# Patient Record
Sex: Male | Born: 1980 | Race: White | Hispanic: No | Marital: Single | Smoking: Former smoker
Health system: Southern US, Community
[De-identification: ages and names within clinical notes are randomized; demographics above are authoritative.]

## PROBLEM LIST (undated history)

## (undated) DIAGNOSIS — F329 Major depressive disorder, single episode, unspecified: Secondary | ICD-10-CM

## (undated) DIAGNOSIS — F1411 Cocaine abuse, in remission: Secondary | ICD-10-CM

## (undated) DIAGNOSIS — F1511 Other stimulant abuse, in remission: Secondary | ICD-10-CM

## (undated) DIAGNOSIS — F410 Panic disorder [episodic paroxysmal anxiety] without agoraphobia: Secondary | ICD-10-CM

## (undated) DIAGNOSIS — F1910 Other psychoactive substance abuse, uncomplicated: Secondary | ICD-10-CM

## (undated) DIAGNOSIS — R42 Dizziness and giddiness: Secondary | ICD-10-CM

## (undated) DIAGNOSIS — R0789 Other chest pain: Secondary | ICD-10-CM

## (undated) DIAGNOSIS — J302 Other seasonal allergic rhinitis: Secondary | ICD-10-CM

## (undated) DIAGNOSIS — F411 Generalized anxiety disorder: Secondary | ICD-10-CM

## (undated) DIAGNOSIS — K2920 Alcoholic gastritis without bleeding: Secondary | ICD-10-CM

## (undated) DIAGNOSIS — F419 Anxiety disorder, unspecified: Secondary | ICD-10-CM

## (undated) DIAGNOSIS — K219 Gastro-esophageal reflux disease without esophagitis: Secondary | ICD-10-CM

## (undated) DIAGNOSIS — G709 Myoneural disorder, unspecified: Secondary | ICD-10-CM

## (undated) HISTORY — DX: Anxiety disorder, unspecified: F41.9

## (undated) HISTORY — DX: Other psychoactive substance abuse, uncomplicated: F19.10

---

## 2014-08-31 ENCOUNTER — Emergency Department: Payer: Self-pay | Admitting: Emergency Medicine

## 2014-08-31 LAB — CBC WITH DIFFERENTIAL/PLATELET
BASOS ABS: 0.1 10*3/uL (ref 0.0–0.1)
Basophil %: 0.6 %
Eosinophil #: 0.1 10*3/uL (ref 0.0–0.7)
Eosinophil %: 1.1 %
HCT: 46.6 % (ref 40.0–52.0)
HGB: 15.8 g/dL (ref 13.0–18.0)
LYMPHS ABS: 2 10*3/uL (ref 1.0–3.6)
LYMPHS PCT: 16.6 %
MCH: 31.9 pg (ref 26.0–34.0)
MCHC: 33.9 g/dL (ref 32.0–36.0)
MCV: 94 fL (ref 80–100)
MONOS PCT: 6 %
Monocyte #: 0.7 x10 3/mm (ref 0.2–1.0)
NEUTROS ABS: 9 10*3/uL — AB (ref 1.4–6.5)
Neutrophil %: 75.7 %
PLATELETS: 223 10*3/uL (ref 150–440)
RBC: 4.96 10*6/uL (ref 4.40–5.90)
RDW: 12.7 % (ref 11.5–14.5)
WBC: 11.9 10*3/uL — ABNORMAL HIGH (ref 3.8–10.6)

## 2014-08-31 LAB — BASIC METABOLIC PANEL
ANION GAP: 5 — AB (ref 7–16)
BUN: 12 mg/dL (ref 7–18)
CREATININE: 1.17 mg/dL (ref 0.60–1.30)
Calcium, Total: 8.9 mg/dL (ref 8.5–10.1)
Chloride: 105 mmol/L (ref 98–107)
Co2: 28 mmol/L (ref 21–32)
EGFR (African American): >60
EGFR (Non-African Amer.): >60
Glucose: 99 mg/dL (ref 65–99)
Osmolality: 275 (ref 275–301)
Potassium: 3.8 mmol/L (ref 3.5–5.1)
Sodium: 138 mmol/L (ref 136–145)

## 2014-08-31 LAB — URINALYSIS, COMPLETE
BLOOD: NEGATIVE
Bacteria: NONE SEEN
Bilirubin,UR: NEGATIVE
Glucose,UR: NEGATIVE mg/dL (ref 0–75)
Ketone: NEGATIVE
Leukocyte Esterase: NEGATIVE
Nitrite: NEGATIVE
PH: 5 (ref 4.5–8.0)
Protein: NEGATIVE
RBC,UR: NONE SEEN /HPF (ref 0–5)
Specific Gravity: 1.016 (ref 1.003–1.030)
WBC UR: NONE SEEN /HPF (ref 0–5)

## 2015-06-27 ENCOUNTER — Emergency Department: Payer: Self-pay

## 2015-06-27 ENCOUNTER — Encounter: Payer: Self-pay | Admitting: Emergency Medicine

## 2015-06-27 ENCOUNTER — Emergency Department
Admission: EM | Admit: 2015-06-27 | Discharge: 2015-06-27 | Disposition: A | Discharge status: Home / Self Care | Payer: Self-pay | Attending: Emergency Medicine | Admitting: Emergency Medicine

## 2015-06-27 DIAGNOSIS — T148XXA Other injury of unspecified body region, initial encounter: Secondary | ICD-10-CM

## 2015-06-27 DIAGNOSIS — Z880 Allergy status to penicillin: Secondary | ICD-10-CM | POA: Insufficient documentation

## 2015-06-27 DIAGNOSIS — F17200 Nicotine dependence, unspecified, uncomplicated: Secondary | ICD-10-CM | POA: Insufficient documentation

## 2015-06-27 DIAGNOSIS — Y9289 Other specified places as the place of occurrence of the external cause: Secondary | ICD-10-CM | POA: Insufficient documentation

## 2015-06-27 DIAGNOSIS — Y288XXA Contact with other sharp object, undetermined intent, initial encounter: Secondary | ICD-10-CM | POA: Insufficient documentation

## 2015-06-27 DIAGNOSIS — Y990 Civilian activity done for income or pay: Secondary | ICD-10-CM | POA: Insufficient documentation

## 2015-06-27 DIAGNOSIS — S61432A Puncture wound without foreign body of left hand, initial encounter: Secondary | ICD-10-CM | POA: Insufficient documentation

## 2015-06-27 DIAGNOSIS — Y9389 Activity, other specified: Secondary | ICD-10-CM | POA: Insufficient documentation

## 2015-06-27 MED ORDER — BACITRACIN ZINC 500 UNIT/GM EX OINT
1.0000 "application " | TOPICAL_OINTMENT | Freq: Two times a day (BID) | CUTANEOUS | Status: DC
  Administered 2015-06-27: 1 via TOPICAL
  Filled 2015-06-27: qty 0.9

## 2015-06-27 MED ORDER — DOXYCYCLINE HYCLATE 100 MG PO CAPS: 100.0000 mg | ORAL_CAPSULE | Freq: Two times a day (BID) | ORAL | Status: DC

## 2015-06-27 MED ORDER — TRAMADOL HCL 50 MG PO TABS: 50.0000 mg | ORAL_TABLET | Freq: Four times a day (QID) | ORAL | Status: DC | PRN

## 2015-06-27 NOTE — ED Notes (Signed)
Pt states he was cutting boards and got a sliver in his had, pt is most affected on the thumb, pt is unable to use flexion on the thumb but can slowly bring it in.

## 2015-06-27 NOTE — ED Notes (Signed)
Betadine/NS placed in basin for pt to soak hand.

## 2015-06-27 NOTE — ED Notes (Signed)
Presents with pain and swelling to left hand   States he got a piece of wood stuck in hand eariler

## 2015-06-27 NOTE — ED Notes (Signed)
Bacitracin and kerlix applied to left hand

## 2015-06-27 NOTE — ED Provider Notes (Signed)
[facility] Emergency Department Provider Note  ____________________________________________  Time seen: Approximately 2:01 PM  I have reviewed the triage vital signs and the nursing notes.   HISTORY  Chief Complaint Hand Pain   HPI Jacob Mckinney is a 34 y.o. male who presents to the emergency department for evaluation of left hand pain. He states that while at work he got a large splinter in the left hand around 9am. He is now having significant pain and swelling. He states that he pulled out what he could, but is unsure that he remove the entire splinter. Tetanus shot is up-to-date.  History reviewed. No pertinent past medical history.  There are no active problems to display for this patient.   History reviewed. No pertinent past surgical history.  Current Outpatient Rx  Name  Route  Sig  Dispense  Refill  . doxycycline (VIBRAMYCIN) 100 MG capsule   Oral   Take 1 capsule (100 mg total) by mouth 2 (two) times daily.   20 capsule   0   . traMADol (ULTRAM) 50 MG tablet   Oral   Take 1 tablet (50 mg total) by mouth every 6 (six) hours as needed.   9 tablet   0     Allergies Penicillins  No family history on file.  Social History Social History  Substance Use Topics  . Smoking status: Current Every Day Smoker  . Smokeless tobacco: None  . Alcohol Use: No    Review of Systems Constitutional: No recent illness. Eyes: No visual changes. ENT: No sore throat. Cardiovascular: Denies chest pain or palpitations. Respiratory: Denies shortness of breath. Gastrointestinal: No abdominal pain.  Genitourinary: Negative for dysuria. Musculoskeletal: Pain in left hand Skin: Negative for rash. Neurological: Negative for headaches, focal weakness or numbness. 10-point ROS otherwise negative.  ____________________________________________   PHYSICAL EXAM:  VITAL SIGNS: ED Triage Vitals  Enc Vitals Group     BP 06/27/15 1337 126/61  mmHg     Pulse Rate 06/27/15 1337 74     Resp 06/27/15 1337 20     Temp 06/27/15 1337 98.2 F (36.8 C)     Temp Source 06/27/15 1337 Oral     SpO2 06/27/15 1337 98 %     Weight 06/27/15 1337 185 lb (83.915 kg)     Height 06/27/15 1337 6\' 2"  (1.88 m)     Head Cir --      Peak Flow --      Pain Score 06/27/15 1337 7     Pain Loc --      Pain Edu? --      Excl. in GC? --     Constitutional: Alert and oriented. Well appearing and in no acute distress. Eyes: Conjunctivae are normal. EOMI. Head: Atraumatic. Nose: No congestion/rhinnorhea. Neck: No stridor.  Respiratory: Normal respiratory effort.   Musculoskeletal: Minimal swelling in the thenar eminence of the left hand without obvious foreign body. There is a puncture wound noted in the webspace of the left thumb. There is full range of motion of the left hand and fingers. Neurologic:  Normal speech and language. No gross focal neurologic deficits are appreciated. Speech is normal. No gait instability. Skin:  Skin is warm, dry and intact. Atraumatic. Psychiatric: Mood and affect are normal. Speech and behavior are normal.  ____________________________________________   LABS (all labs ordered are listed, but only abnormal results are displayed)  Labs Reviewed - No data to display ____________________________________________  RADIOLOGY  No retained radiopaque foreign  body.  I, Homer Pfeifer, personally viewed and evaluated these images (plain radiographs) as part of my medical decision making.   ____________________________________________   PROCEDURES  Procedure(s) performed:  Hand soaked in Betadine and normal saline. Bacitracin was applied to the puncture wound.   ____________________________________________   INITIAL IMPRESSION / ASSESSMENT AND PLAN / ED COURSE  Pertinent labs & imaging results that were available during my care of the patient were reviewed by me and considered in my medical decision making (see  chart for details).  Patient has an allergy to penicillin which prevents prescribing Augmentin. He will take doxycycline. He was advised to take the doxycycline until finished, even if he has no more pain or swelling. He was advised to follow-up with his primary care provider or return to the emergency department for symptoms that change or worsen. ____________________________________________   FINAL CLINICAL IMPRESSION(S) / ED DIAGNOSES  Final diagnoses:  Puncture wound       Chinita Pester, FNP 06/27/15 1531  Myrna Blazer, MD 06/27/15 1539

## 2019-06-28 ENCOUNTER — Emergency Department: Payer: Self-pay

## 2019-06-28 ENCOUNTER — Encounter: Payer: Self-pay | Admitting: Emergency Medicine

## 2019-06-28 ENCOUNTER — Other Ambulatory Visit: Payer: Self-pay

## 2019-06-28 ENCOUNTER — Emergency Department
Admission: EM | Admit: 2019-06-28 | Discharge: 2019-06-28 | Disposition: A | Discharge status: Home / Self Care | Payer: Self-pay | Attending: Emergency Medicine | Admitting: Emergency Medicine

## 2019-06-28 DIAGNOSIS — K2920 Alcoholic gastritis without bleeding: Secondary | ICD-10-CM | POA: Insufficient documentation

## 2019-06-28 DIAGNOSIS — F17200 Nicotine dependence, unspecified, uncomplicated: Secondary | ICD-10-CM | POA: Insufficient documentation

## 2019-06-28 LAB — URINALYSIS, COMPLETE (UACMP) WITH MICROSCOPIC
Bacteria, UA: NONE SEEN
Bilirubin Urine: NEGATIVE
Glucose, UA: NEGATIVE mg/dL
Hgb urine dipstick: NEGATIVE
Ketones, ur: 20 mg/dL — AB
Leukocytes,Ua: NEGATIVE
Nitrite: NEGATIVE
Protein, ur: 30 mg/dL — AB
Specific Gravity, Urine: 1.014 (ref 1.005–1.030)
Squamous Epithelial / LPF: NONE SEEN (ref 0–5)
pH: 6 (ref 5.0–8.0)

## 2019-06-28 LAB — COMPREHENSIVE METABOLIC PANEL
ALT: 116 U/L — ABNORMAL HIGH (ref 0–44)
AST: 171 U/L — ABNORMAL HIGH (ref 15–41)
Albumin: 5 g/dL (ref 3.5–5.0)
Alkaline Phosphatase: 67 U/L (ref 38–126)
Anion gap: 15 (ref 5–15)
BUN: 17 mg/dL (ref 6–20)
CO2: 23 mmol/L (ref 22–32)
Calcium: 9.4 mg/dL (ref 8.9–10.3)
Chloride: 98 mmol/L (ref 98–111)
Creatinine, Ser: 1.09 mg/dL (ref 0.61–1.24)
GFR calc Af Amer: >60 mL/min (ref >60)
GFR calc non Af Amer: >60 mL/min (ref >60)
Glucose, Bld: 101 mg/dL — ABNORMAL HIGH (ref 70–99)
Potassium: 3.9 mmol/L (ref 3.5–5.1)
Sodium: 136 mmol/L (ref 135–145)
Total Bilirubin: 1.3 mg/dL — ABNORMAL HIGH (ref 0.3–1.2)
Total Protein: 8.1 g/dL (ref 6.5–8.1)

## 2019-06-28 LAB — CBC
HCT: 48.5 % (ref 39.0–52.0)
Hemoglobin: 16.8 g/dL (ref 13.0–17.0)
MCH: 32.1 pg (ref 26.0–34.0)
MCHC: 34.6 g/dL (ref 30.0–36.0)
MCV: 92.7 fL (ref 80.0–100.0)
Platelets: 296 10*3/uL (ref 150–400)
RBC: 5.23 MIL/uL (ref 4.22–5.81)
RDW: 12.7 % (ref 11.5–15.5)
WBC: 13 10*3/uL — ABNORMAL HIGH (ref 4.0–10.5)
nRBC: 0 % (ref 0.0–0.2)

## 2019-06-28 LAB — LIPASE, BLOOD: Lipase: 23 U/L (ref 11–51)

## 2019-06-28 MED ORDER — ALUM & MAG HYDROXIDE-SIMETH 200-200-20 MG/5ML PO SUSP
15.0000 mL | Freq: Once | ORAL | Status: AC
  Administered 2019-06-28: 15 mL via ORAL
  Filled 2019-06-28: qty 30

## 2019-06-28 MED ORDER — FAMOTIDINE 20 MG PO TABS
20.0000 mg | ORAL_TABLET | Freq: Once | ORAL | Status: AC
  Administered 2019-06-28: 20 mg via ORAL
  Filled 2019-06-28: qty 1

## 2019-06-28 MED ORDER — SODIUM CHLORIDE 0.9 % IV BOLUS
1000.0000 mL | Freq: Once | INTRAVENOUS | Status: AC
  Administered 2019-06-28: 1000 mL via INTRAVENOUS

## 2019-06-28 MED ORDER — SODIUM CHLORIDE 0.9% FLUSH: 3.0000 mL | Freq: Once | INTRAVENOUS | Status: DC

## 2019-06-28 MED ORDER — IOHEXOL 300 MG/ML  SOLN
100.0000 mL | Freq: Once | INTRAMUSCULAR | Status: AC | PRN
  Administered 2019-06-28: 100 mL via INTRAVENOUS
  Filled 2019-06-28: qty 100

## 2019-06-28 MED ORDER — IOHEXOL 9 MG/ML PO SOLN
500.0000 mL | Freq: Once | ORAL | Status: AC
  Administered 2019-06-28: 500 mL via ORAL
  Filled 2019-06-28: qty 500

## 2019-06-28 NOTE — Discharge Instructions (Addendum)
? ?  Please return to the emergency room right away if you are to develop a fever, severe nausea, your pain becomes severe or worsens, you are unable to keep food down, begin vomiting any dark or bloody fluid, you develop any dark or bloody stools, feel dehydrated, or other new concerns or symptoms arise. ? ?

## 2019-06-28 NOTE — ED Triage Notes (Addendum)
EMS from home , LUQ pain x1 day increased pain on palpation .  Denies N/V/D, Last BM yesterday

## 2019-06-28 NOTE — ED Notes (Signed)
See triage note.  Presents with left upper abd pain    States he drank heavy on Tuesday  Developed pain on weds am  Denies any n/v  abd is tender to palpation to LUQ

## 2019-06-28 NOTE — ED Provider Notes (Signed)
[facility] Emergency Department Provider Note   ____________________________________________   First MD Initiated Contact with Patient 06/28/19 1308     (approximate)  I have reviewed the triage vital signs and the nursing notes.   HISTORY  Chief Complaint Abdominal Pain    HPI Jacob Mckinney is a 38 y.o. male needs for evaluation for left upper abdominal pain  Patient reports he started having some discomfort in his stomach last night, left upper abdomen.  And this morning it seemed to severely worsen.  He felt severe pain in the left upper quadrant.  He reports that when it started he felt very sweaty, lightheaded.  He is feeling improved now, he reports that he did associate drinking quite heavily 2 days prior as he recently broke up with his wife  He reports has been under quite a bit of stress the last 2 days having broken off his relationship with his wife  Denies any nausea or vomiting.  No diarrhea.  Pain is improved now but he is still having some discomfort located below the left lower rib cage in the left upper abdomen  Denies any previous abdominal surgeries.  No past medical history takes no medications no allergies  except penicillin  History reviewed. No pertinent past medical history.  There are no active problems to display for this patient.   History reviewed. No pertinent surgical history.  Prior to Admission medications   Not on File    Allergies Penicillins  No family history on file.  Social History Social History   Tobacco Use   Smoking status: Current Every Day Smoker   Smokeless tobacco: Never Used  Substance Use Topics   Alcohol use: No   Drug use: Not on file    Review of Systems Constitutional: No fever/chills.  No known Covid exposure.  Was routinely tested 2 weeks ago negative Eyes: No visual changes. ENT: No sore throat. Cardiovascular: Denies chest pain. Respiratory: Denies shortness of  breath. Gastrointestinal: See HPI Genitourinary: Negative for dysuria. Musculoskeletal: Negative for back pain. Skin: Negative for rash.  Got very sweaty when the pain was severe but that got better Neurological: Negative for headaches or weakness    ____________________________________________   PHYSICAL EXAM:  VITAL SIGNS: ED Triage Vitals  Enc Vitals Group     BP 06/28/19 1106 (!) 132/92     Pulse Rate 06/28/19 1106 (!) 116     Resp 06/28/19 1106 18     Temp 06/28/19 1106 99.4 F (37.4 C)     Temp Source 06/28/19 1106 Oral     SpO2 06/28/19 1106 100 %     Weight 06/28/19 1107 200 lb (90.7 kg)     Height 06/28/19 1107 6\' 2"  (1.88 m)     Head Circumference --      Peak Flow --      Pain Score 06/28/19 1106 8     Pain Loc --      Pain Edu? --      Excl. in GC? --     Constitutional: Alert and oriented. Well appearing and in no acute distress. Eyes: Conjunctivae are normal. Head: Atraumatic. Nose: No congestion/rhinnorhea. Mouth/Throat: Mucous membranes are moist. Neck: No stridor.  Cardiovascular: Normal rate, regular rhythm. Grossly normal heart sounds.  Good peripheral circulation. Respiratory: Normal respiratory effort.  No retractions. Lungs CTAB. Gastrointestinal: Soft and nontender except he does report some moderate tenderness in the left upper quadrant without rebound or guarding.. No distention. Musculoskeletal:  No lower extremity tenderness nor edema. Neurologic:  Normal speech and language. No gross focal neurologic deficits are appreciated.  Skin:  Skin is warm, dry and intact. No rash noted. Psychiatric: Mood and affect are normal. Speech and behavior are normal.  ____________________________________________   LABS (all labs ordered are listed, but only abnormal results are displayed)  Labs Reviewed  COMPREHENSIVE METABOLIC PANEL - Abnormal; Notable for the following components:      Result Value   Glucose, Bld 101 (*)    AST 171 (*)    ALT 116  (*)    Total Bilirubin 1.3 (*)    All other components within normal limits  CBC - Abnormal; Notable for the following components:   WBC 13.0 (*)    All other components within normal limits  URINALYSIS, COMPLETE (UACMP) WITH MICROSCOPIC - Abnormal; Notable for the following components:   Color, Urine YELLOW (*)    APPearance CLEAR (*)    Ketones, ur 20 (*)    Protein, ur 30 (*)    All other components within normal limits  LIPASE, BLOOD   ____________________________________________  EKG  Reviewed entered by me at 1120 Heart rate 110 QRS 80 QTc 416 Sinus tachycardia, no evidence of acute ischemia ____________________________________________  RADIOLOGY  Ct Abdomen Pelvis W Contrast  Result Date: 06/28/2019 CLINICAL DATA:  Left upper quadrant abdominal pain with pain to palpation. EXAM: CT ABDOMEN AND PELVIS WITH CONTRAST TECHNIQUE: Multidetector CT imaging of the abdomen and pelvis was performed using the standard protocol following bolus administration of intravenous contrast. CONTRAST:  167mL OMNIPAQUE IOHEXOL 300 MG/ML  SOLN COMPARISON:  Abdominal radiographs dated 06/28/2019 FINDINGS: Lower chest: Normal. Hepatobiliary: Small focal area of fatty infiltration in the left lobe adjacent to the falciform ligament. Liver parenchyma is otherwise normal. Biliary tree is normal. Pancreas: Unremarkable. No pancreatic ductal dilatation or surrounding inflammatory changes. Spleen: Normal in size without focal abnormality. Adrenals/Urinary Tract: Adrenal glands are unremarkable. Kidneys are normal, without renal calculi, focal lesion, or hydronephrosis. Bladder is unremarkable. Stomach/Bowel: Stomach is within normal limits. Appendix appears normal. No evidence of bowel wall thickening, distention, or inflammatory changes. Vascular/Lymphatic: No significant vascular findings are present. No enlarged abdominal or pelvic lymph nodes. Reproductive: Prostate is unremarkable. Other: No abdominal wall  hernia or abnormality. No abdominopelvic ascites. Musculoskeletal: No acute or significant osseous findings. IMPRESSION: Benign-appearing abdomen and pelvis. Electronically Signed   By: Lorriane Shire M.D.   On: 06/28/2019 13:59   Dg Abd 2 Views  Result Date: 06/28/2019 CLINICAL DATA:  Left upper quadrant abdominal pain for 1 day. EXAM: ABDOMEN - 2 VIEW COMPARISON:  None. FINDINGS: The lung bases are clear. The abdominal bowel gas pattern is unremarkable. There is scattered air in the small bowel and scattered air and stool throughout the colon. No findings for obstruction or perforation. The soft tissue shadows are maintained. No worrisome calcifications. The bony structures are normal. IMPRESSION: No plain film findings for an acute abdominal process. Electronically Signed   By: Marijo Sanes M.D.   On: 06/28/2019 11:57   CT scan reviewed negative for acute finding.  Some notation of fatty infiltrate left lobe of the liver, patient denies regular heavy alcohol abuse.  Reassuring examination after CT reports he does feel quite a bit better with medications.  ____________________________________________   PROCEDURES  Procedure(s) performed: None  Procedures  Critical Care performed: No  ____________________________________________   INITIAL IMPRESSION / ASSESSMENT AND PLAN / ED COURSE  Pertinent labs & imaging results that  were available during my care of the patient were reviewed by me and considered in my medical decision making (see chart for details).   Differential diagnosis includes but is not limited to, abdominal perforation, aortic dissection, cholecystitis, appendicitis, diverticulitis, colitis, esophagitis/gastritis, kidney stone, pyelonephritis, urinary tract infection, aortic aneurysm. All are considered in decision and treatment plan. Based upon the patient's presentation and risk factors, question is the left upper quadrant pain could be related to his rather heavy alcohol  use.  He binge drank couple days ago, but reports he is not a routine drinker but drank stress from breaking up with his wife.  Reassuring clinical exam except he does have focal discomfort in the left upper quadrant.  We will proceed with CT to rule out other causes such as perforation, colitis, appendicitis esophagitis etc.  ----------------------------------------- 3:20 PM on 06/28/2019 -----------------------------------------  Return precautions and treatment recommendations and follow-up discussed with the patient who is agreeable with the plan.  Patient is resting comfortably.  Feels improved after medications.  No acute distress.  He has minor transaminitis, and he reports an episode of very heavy drinking where he drank at least 18 beers, thinks he had a few shots, but reports things got pretty hazy after that a few days ago.  I suspect this likely precipitated his symptoms and suffering alcoholic gastritis, possibly some very mild hepatitis though very reassuring examination at this time.  He is appropriate for ongoing outpatient treatment.  Patient agreeable with plan and has not plan to continue any episodes of heavy alcohol use.  Reports it was situational around his recent divorce  Return precautions and treatment recommendations and follow-up discussed with the patient who is agreeable with the plan.      ____________________________________________   FINAL CLINICAL IMPRESSION(S) / ED DIAGNOSES  Final diagnoses:  Acute alcoholic gastritis without hemorrhage        Note:  This document was prepared using Dragon voice recognition software and may include unintentional dictation errors       Sharyn Creamer, MD 06/28/19 1521

## 2020-07-05 IMAGING — CR DG ABDOMEN 2V
3 series · 3 of 3 positions shown · non-contrast
Comparison: None.

CLINICAL DATA: Left upper quadrant abdominal pain for 1 day.

EXAM:
ABDOMEN - 2 VIEW

[abdomen erect]
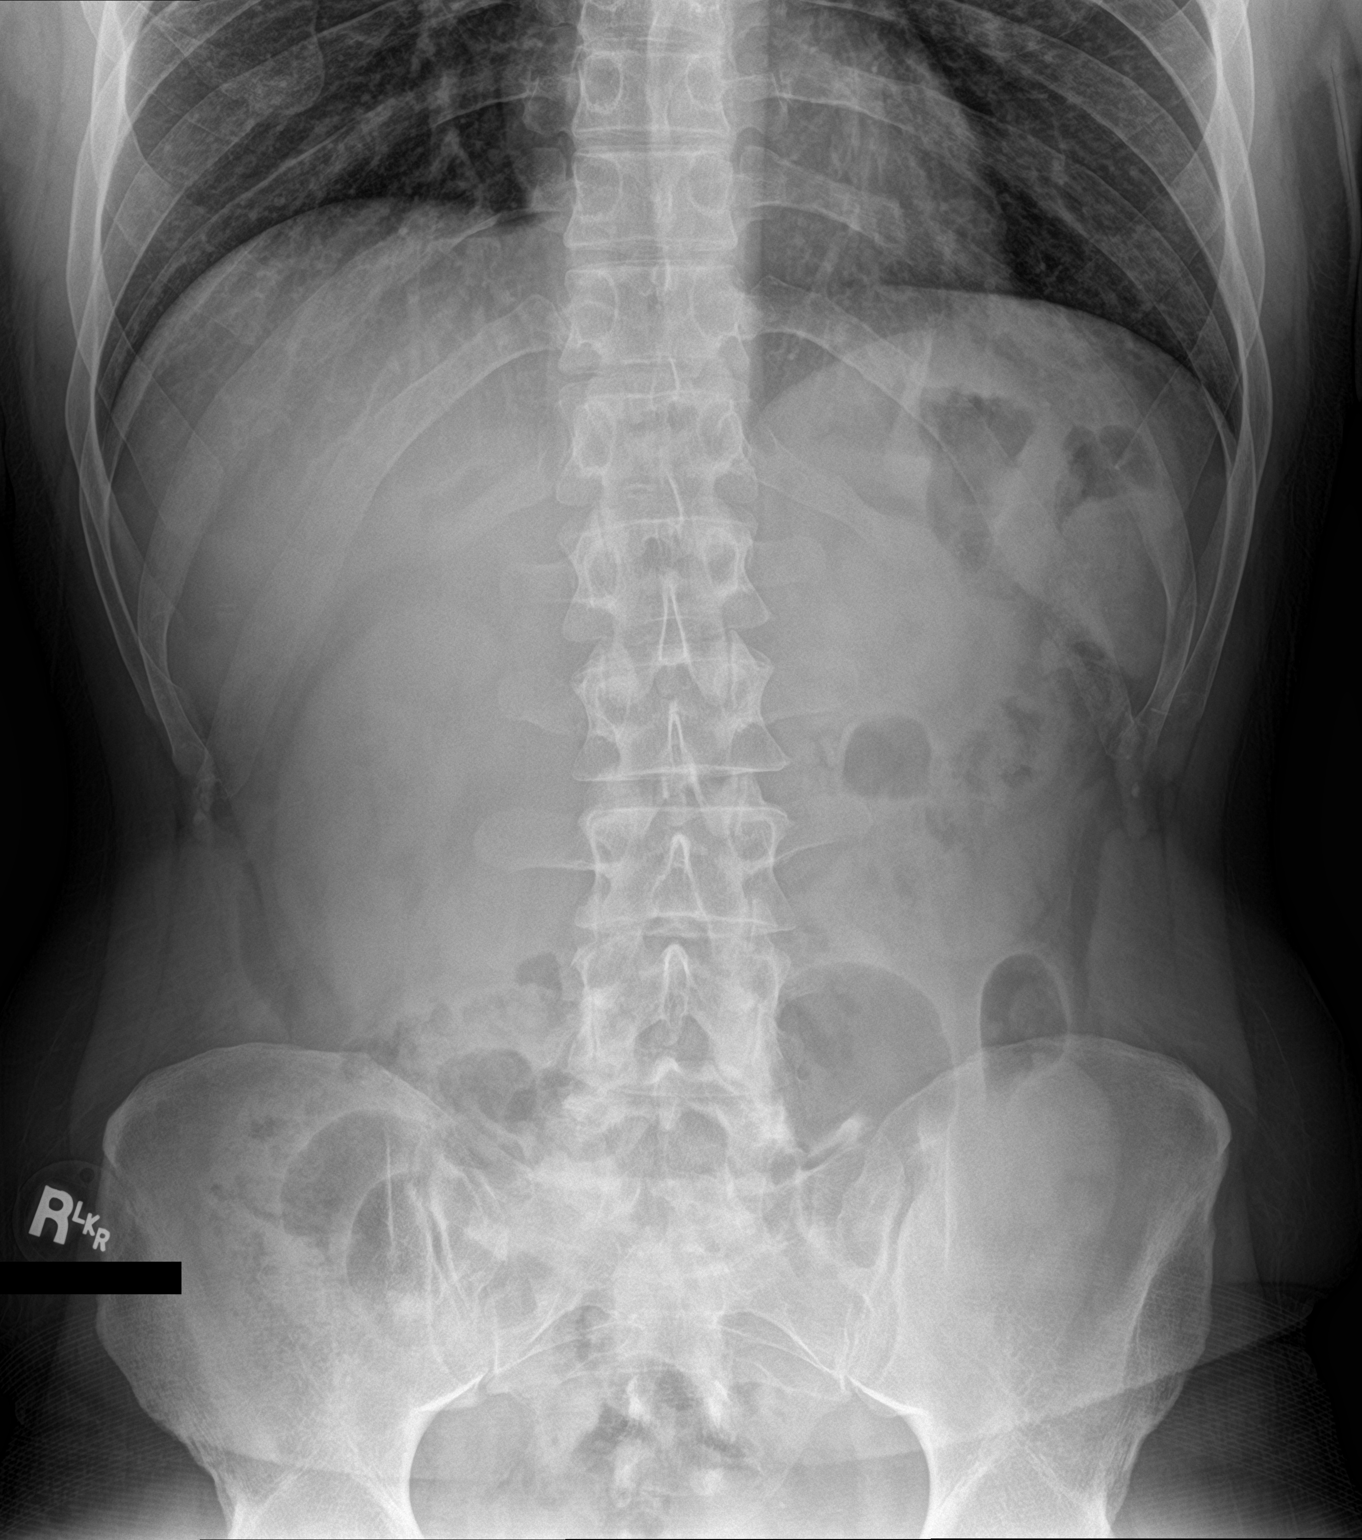

[abdomen supine (1 of 2)]
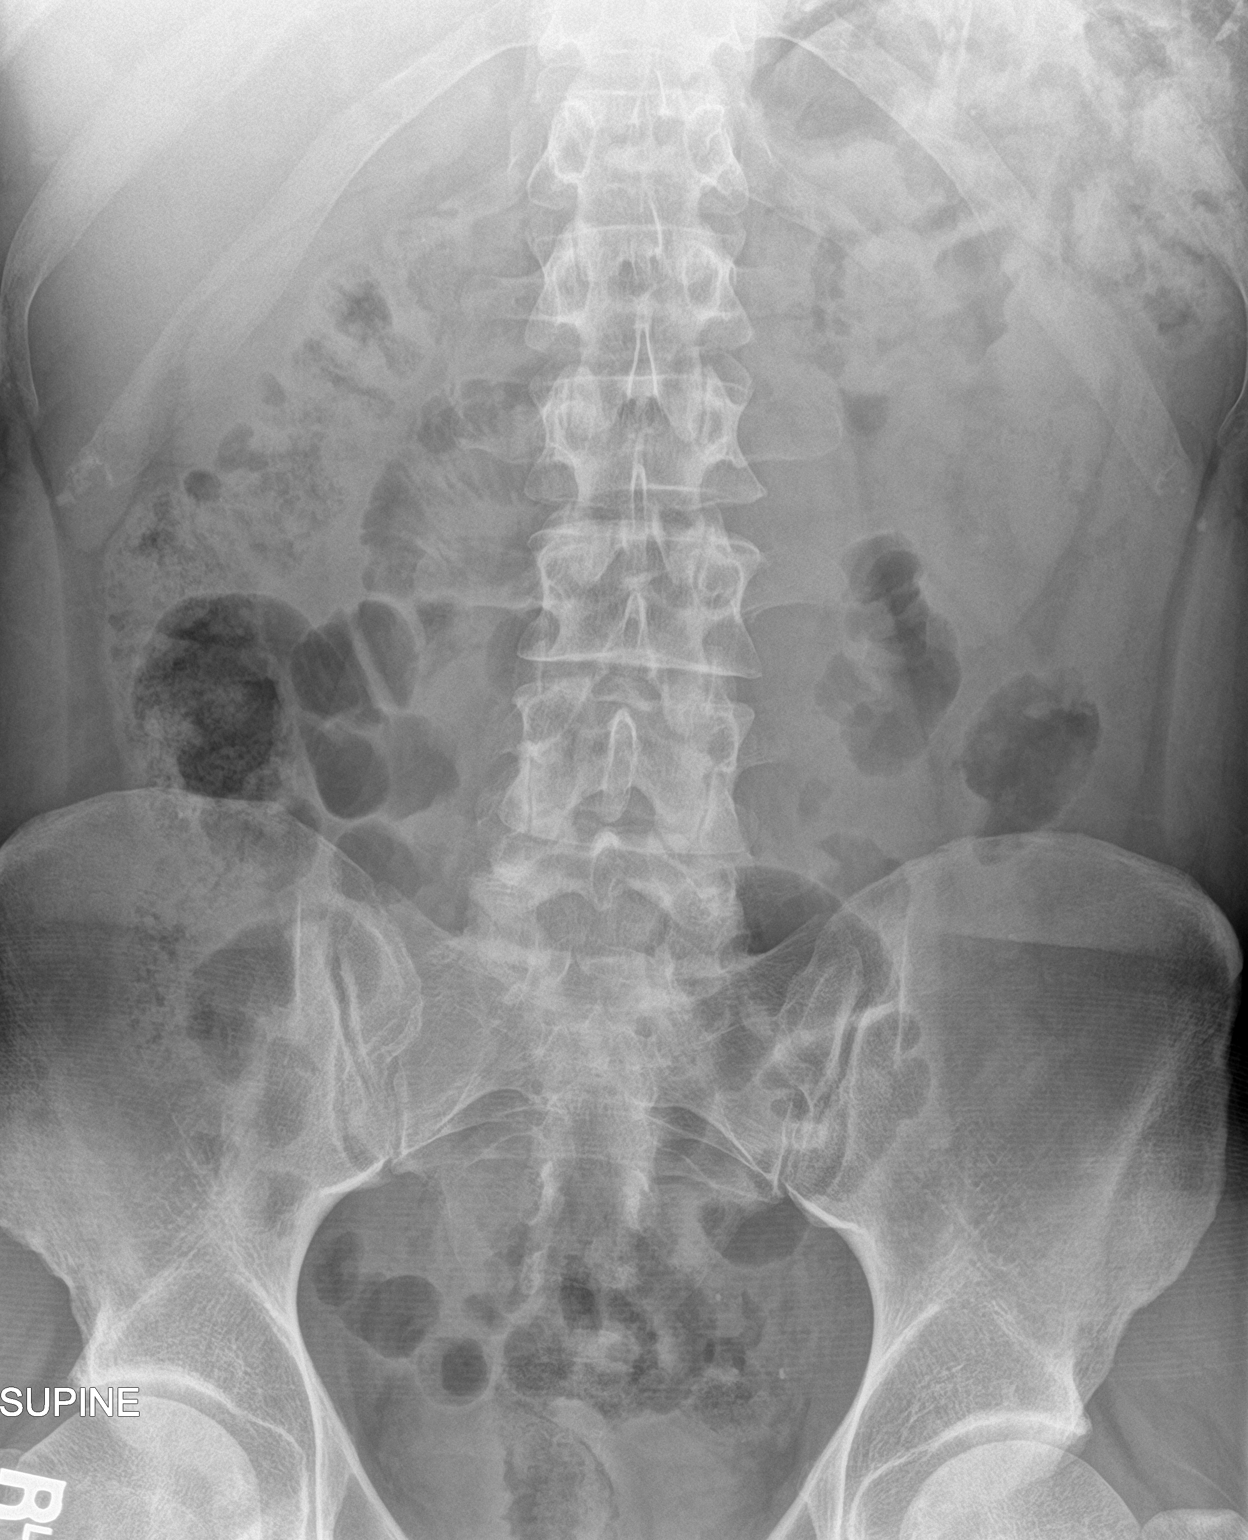

[abdomen supine (2 of 2)]
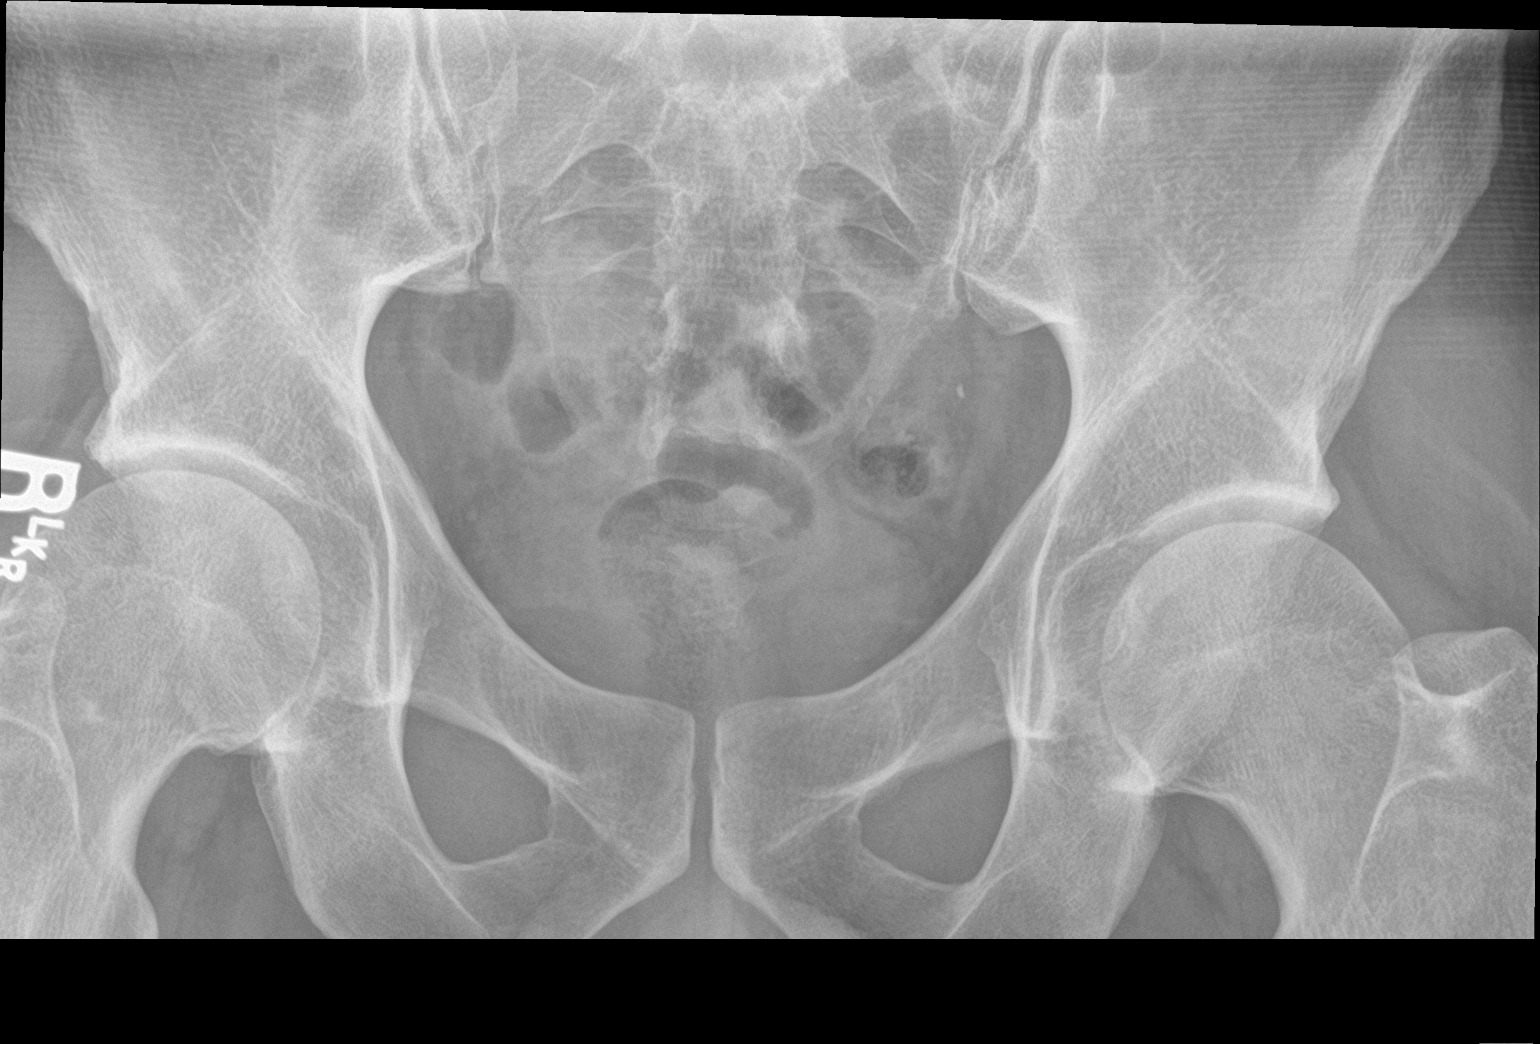

[3 of 3 positions shown; findings below may reference images not displayed]

FINDINGS: The lung bases are clear.

The abdominal bowel gas pattern is unremarkable. There is scattered
air in the small bowel and scattered air and stool throughout the
colon. No findings for obstruction or perforation. The soft tissue
shadows are maintained. No worrisome calcifications. The bony
structures are normal.
IMPRESSION: No plain film findings for an acute abdominal process.

## 2021-10-27 ENCOUNTER — Emergency Department
Admission: EM | Admit: 2021-10-27 | Discharge: 2021-10-27 | Disposition: A | Discharge status: Home / Self Care | Payer: Self-pay | Attending: Emergency Medicine | Admitting: Emergency Medicine

## 2021-10-27 ENCOUNTER — Emergency Department: Payer: Self-pay

## 2021-10-27 ENCOUNTER — Other Ambulatory Visit: Payer: Self-pay

## 2021-10-27 DIAGNOSIS — R079 Chest pain, unspecified: Secondary | ICD-10-CM | POA: Insufficient documentation

## 2021-10-27 DIAGNOSIS — F419 Anxiety disorder, unspecified: Secondary | ICD-10-CM | POA: Insufficient documentation

## 2021-10-27 LAB — CBC
HCT: 47.6 % (ref 39.0–52.0)
Hemoglobin: 16.3 g/dL (ref 13.0–17.0)
MCH: 32 pg (ref 26.0–34.0)
MCHC: 34.2 g/dL (ref 30.0–36.0)
MCV: 93.5 fL (ref 80.0–100.0)
Platelets: 218 10*3/uL (ref 150–400)
RBC: 5.09 MIL/uL (ref 4.22–5.81)
RDW: 13.1 % (ref 11.5–15.5)
WBC: 10.7 10*3/uL — ABNORMAL HIGH (ref 4.0–10.5)
nRBC: 0 % (ref 0.0–0.2)

## 2021-10-27 LAB — BASIC METABOLIC PANEL
Anion gap: 10 (ref 5–15)
BUN: 9 mg/dL (ref 6–20)
CO2: 26 mmol/L (ref 22–32)
Calcium: 9.6 mg/dL (ref 8.9–10.3)
Chloride: 101 mmol/L (ref 98–111)
Creatinine, Ser: 1.04 mg/dL (ref 0.61–1.24)
GFR, Estimated: >60 mL/min (ref >60)
Glucose, Bld: 130 mg/dL — ABNORMAL HIGH (ref 70–99)
Potassium: 4.3 mmol/L (ref 3.5–5.1)
Sodium: 137 mmol/L (ref 135–145)

## 2021-10-27 LAB — TROPONIN I (HIGH SENSITIVITY): Troponin I (High Sensitivity): 6 ng/L (ref <18)

## 2021-10-27 MED ORDER — HYDROXYZINE HCL 50 MG PO TABS: 50.0000 mg | ORAL_TABLET | Freq: Once | ORAL | Status: DC

## 2021-10-27 MED ORDER — HYDROXYZINE PAMOATE 100 MG PO CAPS
100.0000 mg | ORAL_CAPSULE | Freq: Three times a day (TID) | ORAL | 0 refills | Status: DC | PRN
Start: 2021-10-27 — End: 2023-02-24

## 2021-10-27 MED ORDER — HYDROXYZINE HCL 50 MG PO TABS
100.0000 mg | ORAL_TABLET | Freq: Once | ORAL | Status: AC
Start: 2021-10-27 — End: 2021-10-27
  Administered 2021-10-27: 100 mg via ORAL
  Filled 2021-10-27: qty 2

## 2021-10-27 NOTE — ED Triage Notes (Signed)
Pt comes with c/o left sided  CP that started yesterday. Pt states this got worse today. Pt state he knows what it is from. Pt admits to using cocaine and meth together. ?

## 2021-10-27 NOTE — ED Provider Notes (Signed)
? ?[facility] [address] ?Provider Note ? ? Event Date/Time  ? First MD Initiated Contact with Patient 10/27/21 2037   ?  (approximate) ?History  ?Chest Pain ? ?HPI ?Jacob Mckinney is a 41 y.o. 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  Patient states that he used intranasal cocaine, smoked methamphetamines, and drank alcohol 4 days prior to arrival and began having left-sided sharp chest pains at that time.  Patient states that these pains have been waxing and waning in intensity since onset and are accompanied by feelings of significant anxiety including the sensation that people are in his house and that he needs to get out.  Patient denies ever having similar symptoms in the past.  Patient endorses associated tachypnea and upper and lower extremity paresthesias.  Patient denies pain radiating to the neck, back, or left arm.  Denies any productive cough, nausea/vomiting, abdominal pain ?Physical Exam  ?Triage Vital Signs: ?ED Triage Vitals  ?Enc Vitals Group  ?   BP 10/27/21 1636 (!) 137/122  ?   Pulse Rate 10/27/21 1636 (!) 119  ?   Resp 10/27/21 1636 16  ?   Temp 10/27/21 1636 98 ?F (36.7 ?C)  ?   Temp Source 10/27/21 1636 Oral  ?   SpO2 10/27/21 1636 100 %  ?   Weight --   ?   Height --   ?   Head Circumference --   ?   Peak Flow --   ?   Pain Score 10/27/21 1632 10  ?   Pain Loc --   ?   Pain Edu? --   ?   Excl. in Minor? --   ? ?Most recent vital signs: ?Vitals:  ? 10/27/21 1636 10/27/21 2116  ?BP: (!) 137/122 (!) 127/92  ?Pulse: (!) 119 100  ?Resp: 16 20  ?Temp: 98 ?F (36.7 ?C) 98.1 ?F (36.7 ?C)  ?SpO2: 100% 100%  ? ?General: Awake, oriented x4. ?CV:  Good peripheral perfusion.  ?Resp:  Normal effort.  ?Abd:  No distention.  ?Other:  Anxious appearing middle aged 37 male laying in bed ?ED Results / Procedures / Treatments  ?Labs ?(all labs ordered are listed, but  only abnormal results are displayed) ?Labs Reviewed  ?BASIC METABOLIC PANEL - Abnormal; Notable for the following components:  ?    Result Value  ? Glucose, Bld 130 (*)   ? All other components within normal limits  ?CBC - Abnormal; Notable for the following components:  ? WBC 10.7 (*)   ? All other components within normal limits  ?TROPONIN I (HIGH SENSITIVITY)  ?TROPONIN I (HIGH SENSITIVITY)  ? ?EKG ?ED ECG REPORT ?I, Naaman Plummer, the attending physician, personally viewed and interpreted this ECG. ?Date: 10/27/2021 ?EKG Time: 1638 ?Rate: 109 ?Rhythm: normal sinus rhythm ?QRS Axis: normal ?Intervals: normal ?ST/T Wave abnormalities: normal ?Narrative Interpretation: no evidence of acute ischemia ?RADIOLOGY ?ED MD interpretation: 2 view chest x-ray interpreted by me shows no evidence of acute abnormalities including no pneumonia, pneumothorax, or widened mediastinum ?-Agree with radiology assessment ?Official radiology report(s): ?DG Chest 2 View ? ?Result Date: 10/27/2021 ?CLINICAL DATA:  Chest pain EXAM: CHEST - 2 VIEW COMPARISON:  None. FINDINGS: The heart size and mediastinal contours are within normal limits. Both lungs are clear. The visualized skeletal structures are unremarkable. IMPRESSION: No active cardiopulmonary disease. Electronically Signed   By:  Jacqulynn Cadet M.D.   On: 10/27/2021 16:53   ?PROCEDURES: ?Critical Care performed: No ?Procedures ?MEDICATIONS ORDERED IN ED: ?Medications  ?hydrOXYzine (ATARAX) tablet 100 mg (100 mg Oral Given 10/27/21 2110)  ? ?IMPRESSION / MDM / ASSESSMENT AND PLAN / ED COURSE  ?I reviewed the triage vital signs and the nursing notes. ?             ?               ?The patient is on the cardiac monitor to evaluate for evidence of arrhythmia and/or significant heart rate changes. ?Workup: ECG, CXR, CBC, BMP, Troponin ?Findings: ?ECG: No overt evidence of STEMI. No evidence of Brugada?s sign, delta wave, epsilon wave, significantly prolonged QTc, or malignant  arrhythmia ?HS Troponin: Negative x1 ?Other Labs unremarkable for emergent problems. ?CXR: Without PTX, PNA, or widened mediastinum ?Last Stress Test: Never ?Last Heart Catheterization: Never ?HEART Score: 2 ? ?Given History, Exam, and Workup I have low suspicion for ACS, Pneumothorax, Pneumonia, Pulmonary Embolus, Tamponade, Aortic Dissection or other emergent problem as a cause for this presentation.  Given patient's history of cocaine, methamphetamine, and alcohol abuse approximately 4 days ago as well as accompanying anxiety, chest pain likely coming from possible panic attacks and/or side effects from these sympathomimetic drugs.  Encouraged sympathomimetic drug cessation ? ?Reassesment: Prior to discharge patient?s pain was controlled and they were well appearing. ?Rx: Hydroxyzine 100 mg as needed for anxiety ?Disposition:  Discharge. Strict return precautions discussed with patient with full understanding. Advised patient to follow up promptly with primary care provider ?  ?FINAL CLINICAL IMPRESSION(S) / ED DIAGNOSES  ? ?Final diagnoses:  ?Chest pain, unspecified type  ?Anxiety  ? ?Rx / DC Orders  ? ?ED Discharge Orders   ? ?      Ordered  ?  hydrOXYzine (VISTARIL) 100 MG capsule  3 times daily PRN       ? 10/27/21 2103  ? ?  ?  ? ?  ? ?Note:  This document was prepared using Dragon voice recognition software and may include unintentional dictation errors. ?  ?Naaman Plummer, MD ?10/27/21 2142 ? ?

## 2022-09-30 ENCOUNTER — Emergency Department
Admission: EM | Admit: 2022-09-30 | Discharge: 2022-09-30 | Disposition: A | Discharge status: Home / Self Care | Payer: Self-pay | Attending: Emergency Medicine | Admitting: Emergency Medicine

## 2022-09-30 ENCOUNTER — Emergency Department: Payer: Self-pay

## 2022-09-30 ENCOUNTER — Other Ambulatory Visit: Payer: Self-pay

## 2022-09-30 DIAGNOSIS — R0789 Other chest pain: Secondary | ICD-10-CM | POA: Insufficient documentation

## 2022-09-30 DIAGNOSIS — Z1152 Encounter for screening for COVID-19: Secondary | ICD-10-CM | POA: Insufficient documentation

## 2022-09-30 DIAGNOSIS — R202 Paresthesia of skin: Secondary | ICD-10-CM | POA: Insufficient documentation

## 2022-09-30 LAB — BASIC METABOLIC PANEL
Anion gap: 6 (ref 5–15)
BUN: 13 mg/dL (ref 6–20)
CO2: 26 mmol/L (ref 22–32)
Calcium: 9.4 mg/dL (ref 8.9–10.3)
Chloride: 107 mmol/L (ref 98–111)
Creatinine, Ser: 0.94 mg/dL (ref 0.61–1.24)
GFR, Estimated: >60 mL/min (ref >60)
Glucose, Bld: 99 mg/dL (ref 70–99)
Potassium: 4.6 mmol/L (ref 3.5–5.1)
Sodium: 139 mmol/L (ref 135–145)

## 2022-09-30 LAB — CBC
HCT: 47.6 % (ref 39.0–52.0)
Hemoglobin: 15.8 g/dL (ref 13.0–17.0)
MCH: 31.2 pg (ref 26.0–34.0)
MCHC: 33.2 g/dL (ref 30.0–36.0)
MCV: 93.9 fL (ref 80.0–100.0)
Platelets: 251 10*3/uL (ref 150–400)
RBC: 5.07 MIL/uL (ref 4.22–5.81)
RDW: 13.1 % (ref 11.5–15.5)
WBC: 5.9 10*3/uL (ref 4.0–10.5)
nRBC: 0 % (ref 0.0–0.2)

## 2022-09-30 LAB — RESP PANEL BY RT-PCR (RSV, FLU A&B, COVID)  RVPGX2
Influenza A by PCR: NEGATIVE
Influenza B by PCR: NEGATIVE
Resp Syncytial Virus by PCR: NEGATIVE
SARS Coronavirus 2 by RT PCR: NEGATIVE

## 2022-09-30 LAB — TROPONIN I (HIGH SENSITIVITY)
Troponin I (High Sensitivity): 3 ng/L (ref <18)
Troponin I (High Sensitivity): 3 ng/L (ref <18)

## 2022-09-30 LAB — TSH: TSH: 1.028 u[IU]/mL (ref 0.350–4.500)

## 2022-09-30 MED ORDER — MECLIZINE HCL 25 MG PO TABS: 25.0000 mg | ORAL_TABLET | Freq: Three times a day (TID) | ORAL | 0 refills | Status: DC | PRN

## 2022-09-30 MED ORDER — HYDROXYZINE HCL 50 MG PO TABS: 50.0000 mg | ORAL_TABLET | Freq: Three times a day (TID) | ORAL | 0 refills | Status: DC | PRN

## 2022-09-30 NOTE — ED Triage Notes (Signed)
Pt here with left side numbness and possible rib pain. Pt states when he walks, the left side of his body gets numb. Pt denies injury. Pt also having cp as well.

## 2022-09-30 NOTE — Discharge Instructions (Signed)
You may take the meclizine as needed for the dizziness especially if the dizziness feels like spinning or movement.  You may take the hydroxyzine as needed for anxiety.  We have provided referral to cardiology as well as information for a primary care clinic.  Return to the ER for new, worsening, or persistent severe dizziness, weakness or numbness, chest pain, difficulty breathing, severe headache, or any other new or worsening symptoms that concern you.

## 2022-09-30 NOTE — ED Provider Notes (Signed)
[facility] Provider Note    Event Date/Time   First MD Initiated Contact with Patient 09/30/22 1112     (approximate)   History   Shortness of Breath   HPI  Jacob Mckinney is a 42 y.o. male with a history of polysubstance abuse (including cocaine and amphetamines), not currently on any medications, who presents with multiple complaints that have been occurring over approximately the last 1 to 2 weeks.  The patient reports chest pain which is located below the ribs on the left and sharp.  It does not radiate.  It is worse with palpation and with certain positions.  He feels like he has a knot there.  He denies associated shortness of breath.    He also reports lightheadedness with standing and with exertion over approximately the last few weeks.  He states that he will frequently get lightheaded when he gets up and walks around.  It will get better after a few minutes.  He denies a sensation of spinning or vertigo.  He has no chest pain or difficulty breathing during these dizziness episodes.  The patient states that during these episodes he also gets numbness in his arms and hands, mainly on the left but sometimes also on the right.  He does not have significant numbness in the legs.  He states that this symptom comes and goes and sometimes occurs without the lightheadedness.  He denies any significant headache or neck pain.  He has no weakness in the arms or legs.  He denies tingling or paresthesias.  I reviewed the past [facility] records.  The patient was most recently seen in the ED on 4/4 of last year for chest pain and had a negative workup at that time.  He has no other recent hospital or outpatient visits.   Physical Exam   Triage Vital Signs: ED Triage Vitals  Enc Vitals Group     BP 09/30/22 0907 (!) 149/98     Pulse Rate 09/30/22 0907 69     Resp 09/30/22 0907 20     Temp 09/30/22 0907 98.1 F (36.7 C)     Temp Source 09/30/22 0907 Oral      SpO2 09/30/22 0907 98 %     Weight 09/30/22 0905 199 lb 15.3 oz (90.7 kg)     Height 09/30/22 0905 '6\' 2"'$  (1.88 m)     Head Circumference --      Peak Flow --      Pain Score 09/30/22 0905 9     Pain Loc --      Pain Edu? --      Excl. in [place]? --     Most recent vital signs: Vitals:   09/30/22 0907 09/30/22 1200  BP: (!) 149/98 (!) 123/94  Pulse: 69 70  Resp: 20 18  Temp: 98.1 F (36.7 C)   SpO2: 98% 94%     General: Alert and oriented, well-appearing. CV:  Good peripheral perfusion.  Normal heart sounds. Resp:  Normal effort.  Lungs CTAB. Abd:  No distention.  Mild tenderness to left upper abdominal wall just under the left rib area. Other:  EOMI.  PERRLA.  No facial droop.  No pronator drift.  No ataxia on finger-to-nose.  5/5 motor strength and sensation intact bilateral upper and lower extremities.   ED Results / Procedures / Treatments   Labs (all labs ordered are listed, but only abnormal results are displayed) Labs Reviewed  RESP PANEL BY RT-PCR (RSV,  FLU A&B, COVID)  [id number]  BASIC METABOLIC PANEL  CBC  TSH  TROPONIN I (HIGH SENSITIVITY)  TROPONIN I (HIGH SENSITIVITY)     EKG  ED ECG REPORT I, Arta Silence, the attending physician, personally viewed and interpreted this ECG.  Date: 09/30/2022 EKG Time: 0905 Rate: 71 Rhythm: normal sinus rhythm QRS Axis: normal Intervals: normal ST/T Wave abnormalities: normal Narrative Interpretation: no evidence of acute ischemia    RADIOLOGY  Chest x-ray: I independently viewed and interpreted the images; there is no focal consolidation or edema   PROCEDURES:  Critical Care performed: No  Procedures   MEDICATIONS ORDERED IN ED: Medications - No data to display   IMPRESSION / MDM / [place] / ED COURSE  I reviewed the triage vital signs and the nursing notes.  42 year old male with no active medical problems presents with multiple complaints with a subacute time course, specifically  atypical chest wall/upper abdominal wall pain, lightheadedness on standing and exertion, and intermittent altered sensation to the arms particular on the left.  Differential diagnosis includes, but is not limited to:  Chest pain: This is consistent with musculoskeletal etiology as it is fairly localized, reproducible with palpation, and the patient has no significant ACS risk factors.  There is no significant abdominal tenderness.  Initial troponin is negative.  EKG is nonischemic.  We will obtain a repeat troponin.  The patient is PERC negative.  There is no evidence of aortic dissection or other vascular etiology.  Lightheadedness: Differential is broad but includes dehydration, viral syndrome, cardiac disorder, POTS or other benign etiology.  Basic labs are unremarkable with no evidence of anemia, electrolyte abnormalities or other concerning findings.  Will obtain respiratory panel, TSH, and reassess.  Sensory symptoms: These mainly localized to the left arm but sometimes cross the midline.  Differential includes peripheral neuropathy, radiculopathy, complex migraine.  Will obtain CT head to rule out acute CNS etiology although my suspicion is low.  The patient currently has no neurologic deficits or indication for MRI of the spine or other advanced imaging.  Patient's presentation is most consistent with acute complicated illness / injury requiring diagnostic workup.  ----------------------------------------- 1:55 PM on 09/30/2022 -----------------------------------------  CT head is negative for acute findings.  TSH is normal and the respiratory panel is negative.  At this time, there is no evidence of an emergent etiology of the patient's symptoms or indication for further ED workup.  The patient is stable for discharge home.  He does endorse anxiety exacerbating the symptoms, and now states that the dizziness often does manifest as a spinning sensation.  I have prescribed hydroxyzine for  anxiety and meclizine for possible vertigo.  I counseled the patient on the results of the workup.  I provided cardiology and primary care referrals.  I gave strict return precautions and he expresses understanding.    FINAL CLINICAL IMPRESSION(S) / ED DIAGNOSES   Final diagnoses:  Atypical chest pain  Arm paresthesia, left     Rx / DC Orders   ED Discharge Orders          Ordered    Ambulatory referral to Cardiology       Comments: If you have not heard from the Cardiology office within the next 72 hours please call [phone].   09/30/22 1353    hydrOXYzine (ATARAX) 50 MG tablet  3 times daily PRN        09/30/22 1354    meclizine (ANTIVERT) 25 MG tablet  3 times daily  PRN        09/30/22 1354             Note:  This document was prepared using Dragon voice recognition software and may include unintentional dictation errors.    Arta Silence, MD 09/30/22 1356

## 2022-11-04 IMAGING — CR DG CHEST 2V
2 series · 2 of 2 positions shown · non-contrast
Comparison: None.

CLINICAL DATA: Chest pain

EXAM:
CHEST - 2 VIEW

[chest pa]
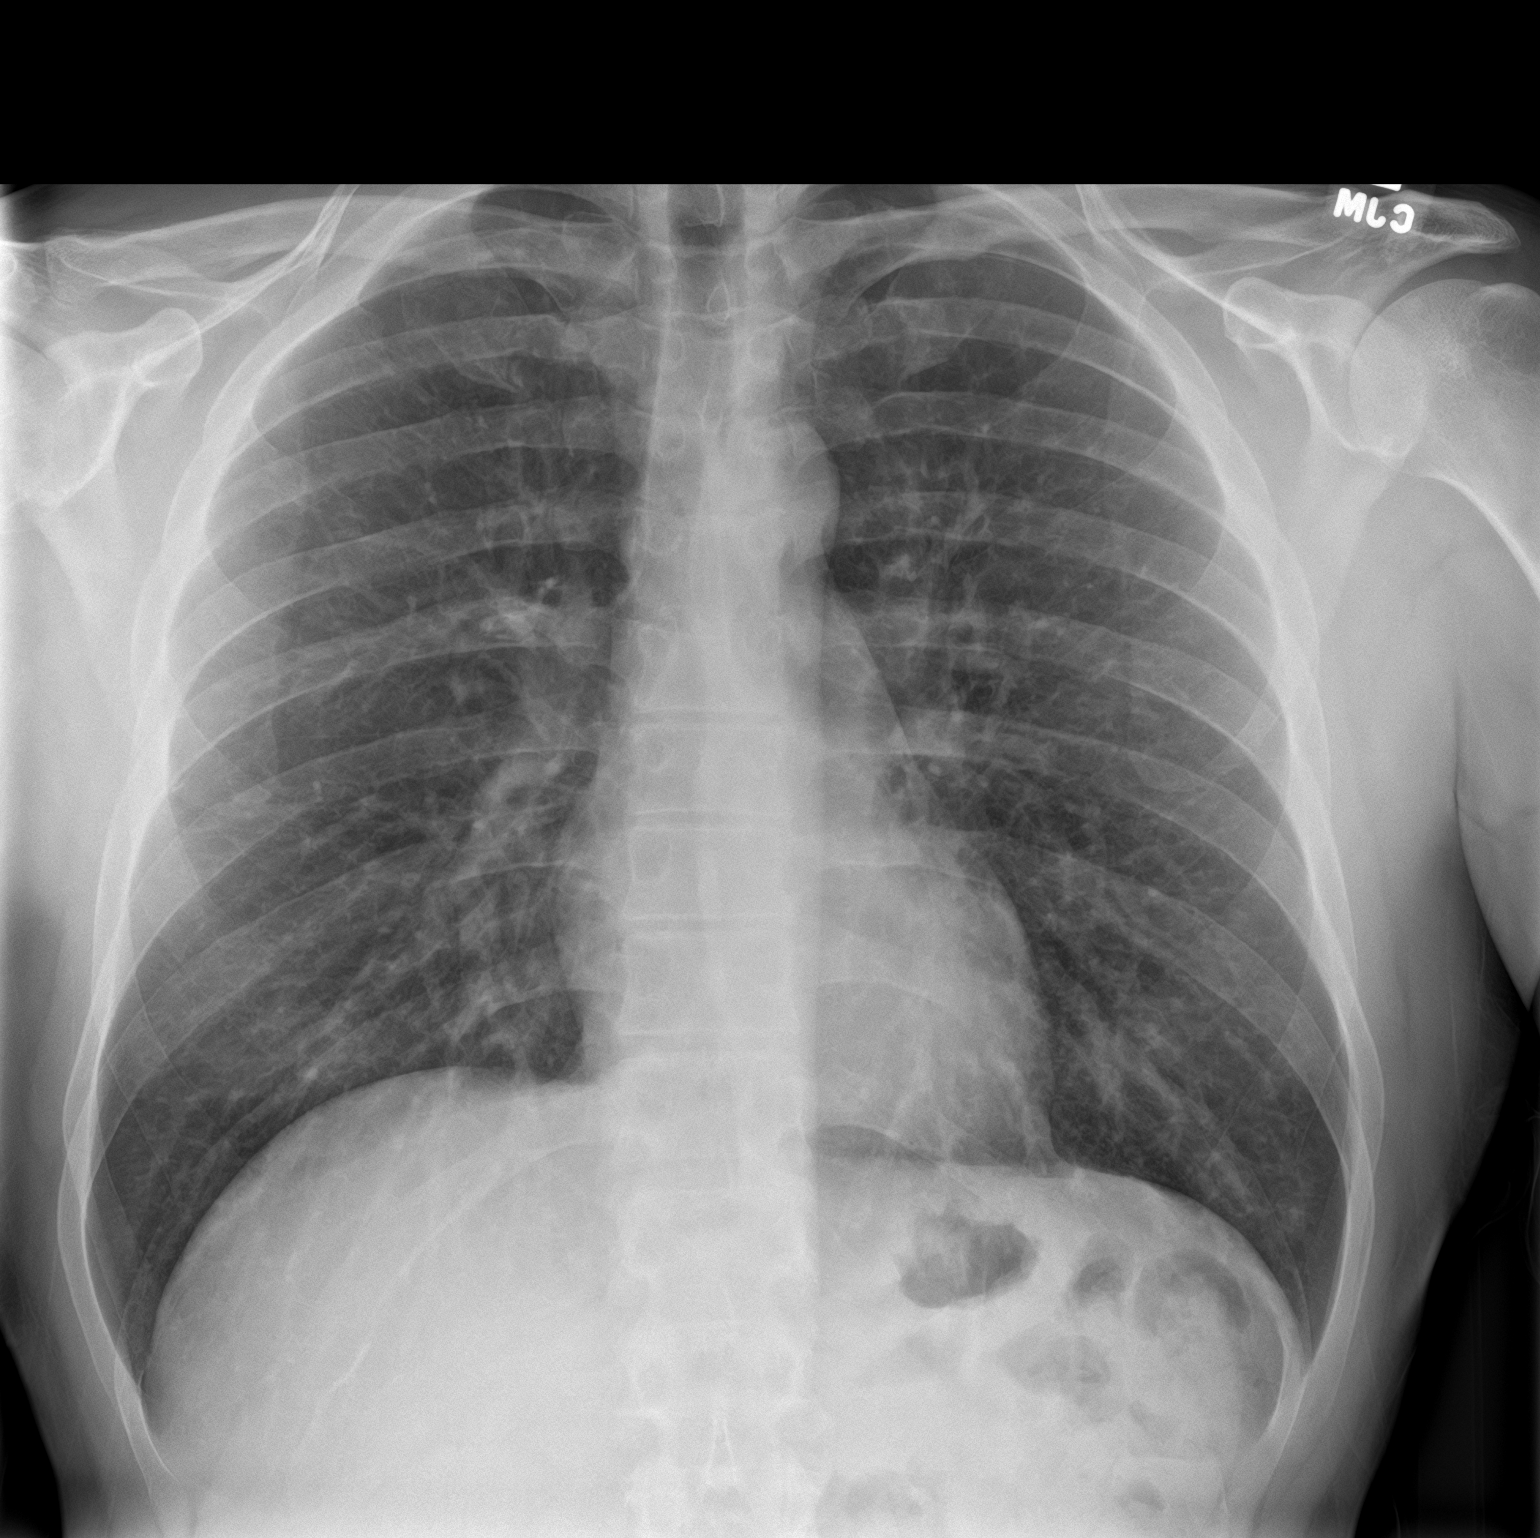

[chest lat]
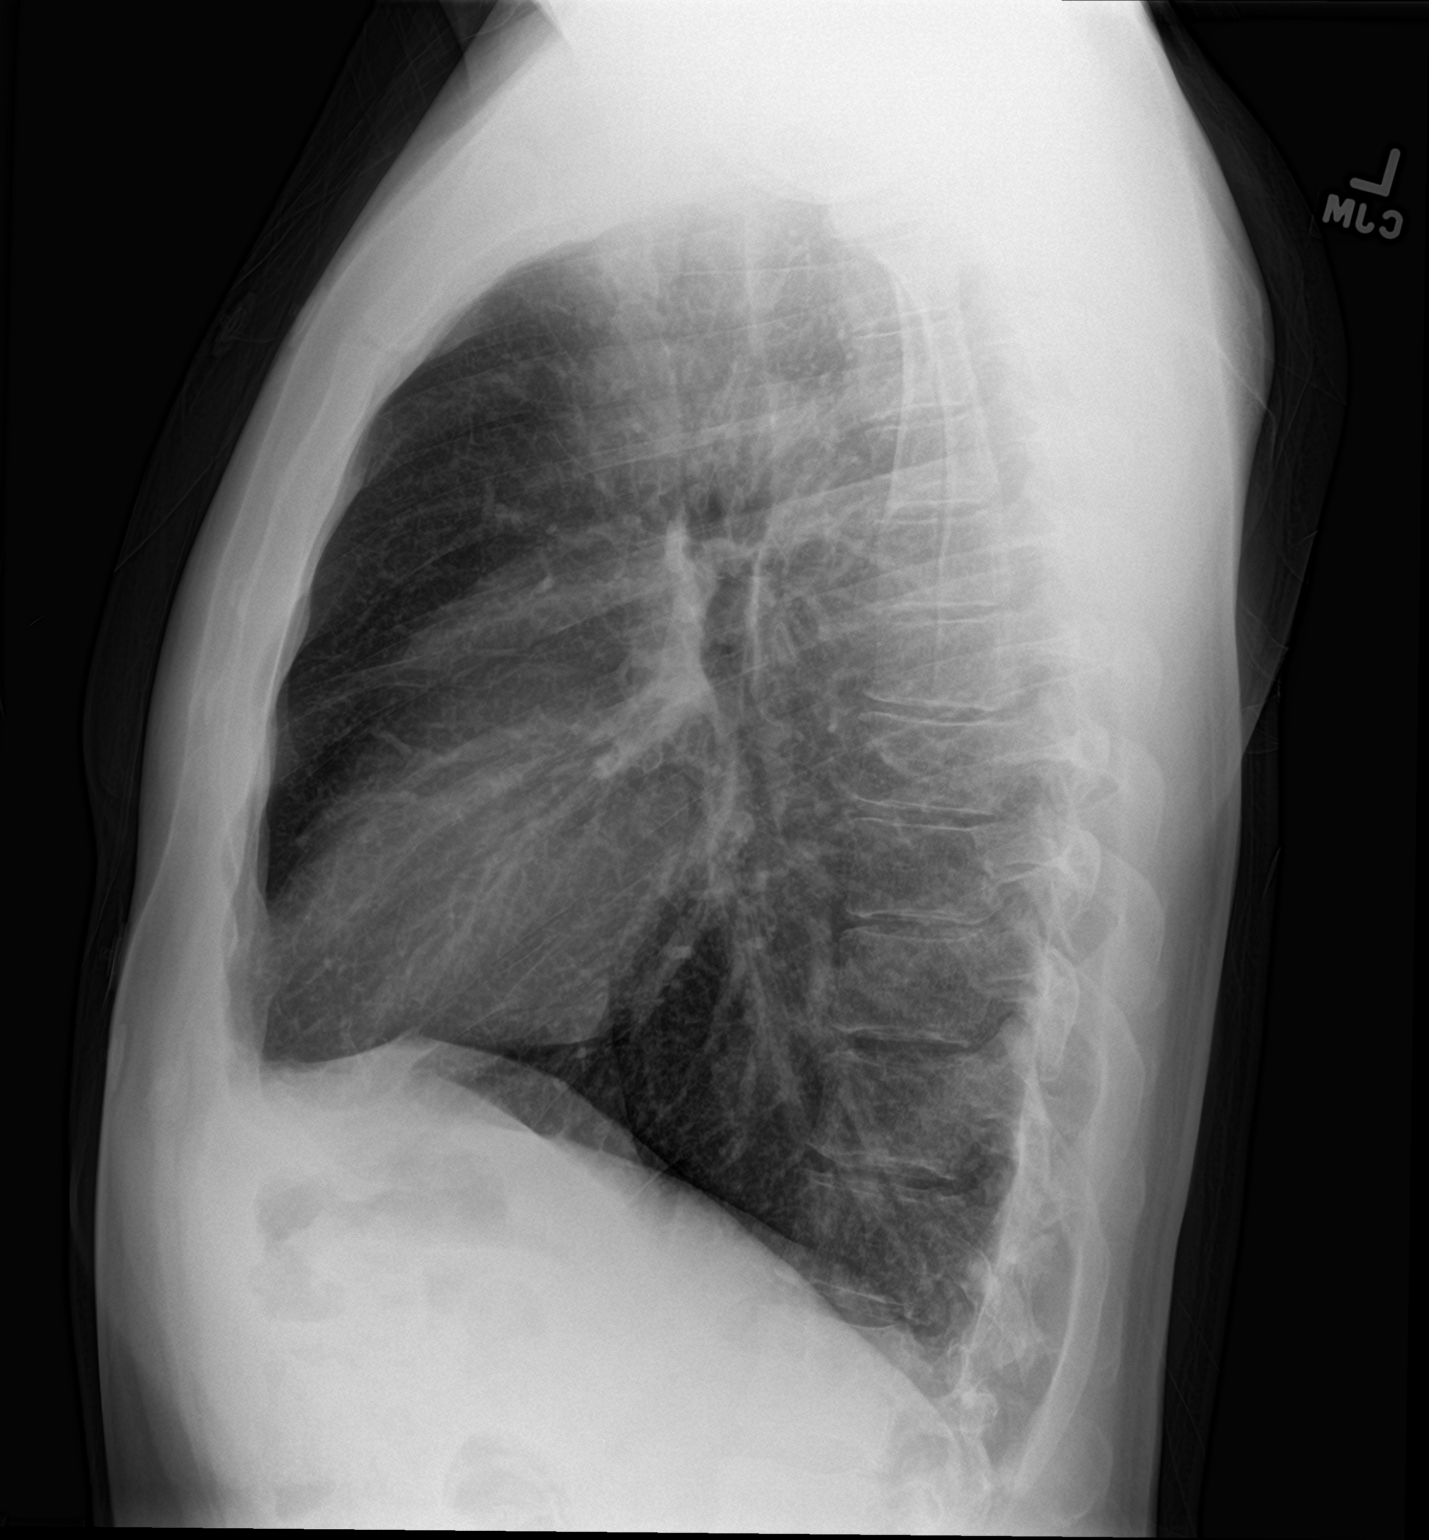

[2 of 2 positions shown; findings below may reference images not displayed]

FINDINGS: The heart size and mediastinal contours are within normal limits.
Both lungs are clear. The visualized skeletal structures are
unremarkable.
IMPRESSION: No active cardiopulmonary disease.

## 2023-01-24 DIAGNOSIS — Z419 Encounter for procedure for purposes other than remedying health state, unspecified: Secondary | ICD-10-CM | POA: Diagnosis not present

## 2023-01-31 ENCOUNTER — Encounter: Payer: Self-pay | Admitting: Emergency Medicine

## 2023-01-31 ENCOUNTER — Ambulatory Visit
Admission: EM | Admit: 2023-01-31 | Discharge: 2023-01-31 | Disposition: A | Discharge status: Home / Self Care | Payer: Medicaid Other | Attending: Emergency Medicine | Admitting: Emergency Medicine

## 2023-01-31 DIAGNOSIS — K2100 Gastro-esophageal reflux disease with esophagitis, without bleeding: Secondary | ICD-10-CM | POA: Diagnosis not present

## 2023-01-31 DIAGNOSIS — M546 Pain in thoracic spine: Secondary | ICD-10-CM | POA: Diagnosis not present

## 2023-01-31 DIAGNOSIS — H9313 Tinnitus, bilateral: Secondary | ICD-10-CM

## 2023-01-31 LAB — COMPREHENSIVE METABOLIC PANEL
ALT: 22 U/L (ref 0–44)
AST: 20 U/L (ref 15–41)
Albumin: 4.6 g/dL (ref 3.5–5.0)
Alkaline Phosphatase: 66 U/L (ref 38–126)
Anion gap: 7 (ref 5–15)
BUN: 11 mg/dL (ref 6–20)
CO2: 27 mmol/L (ref 22–32)
Calcium: 9.1 mg/dL (ref 8.9–10.3)
Chloride: 102 mmol/L (ref 98–111)
Creatinine, Ser: 0.9 mg/dL (ref 0.61–1.24)
GFR, Estimated: >60 mL/min (ref >60)
Glucose, Bld: 84 mg/dL (ref 70–99)
Potassium: 4.1 mmol/L (ref 3.5–5.1)
Sodium: 136 mmol/L (ref 135–145)
Total Bilirubin: 0.4 mg/dL (ref 0.3–1.2)
Total Protein: 7.8 g/dL (ref 6.5–8.1)

## 2023-01-31 LAB — URINALYSIS, W/ REFLEX TO CULTURE (INFECTION SUSPECTED)
Bilirubin Urine: NEGATIVE
Glucose, UA: NEGATIVE mg/dL
Hgb urine dipstick: NEGATIVE
Ketones, ur: NEGATIVE mg/dL
Leukocytes,Ua: NEGATIVE
Nitrite: NEGATIVE
Protein, ur: NEGATIVE mg/dL
Specific Gravity, Urine: 1.015 (ref 1.005–1.030)
pH: 7 (ref 5.0–8.0)

## 2023-01-31 LAB — CBC WITH DIFFERENTIAL/PLATELET
Abs Immature Granulocytes: 0.01 10*3/uL (ref 0.00–0.07)
Basophils Absolute: 0 10*3/uL (ref 0.0–0.1)
Basophils Relative: 1 %
Eosinophils Absolute: 0.1 10*3/uL (ref 0.0–0.5)
Eosinophils Relative: 2 %
HCT: 45.5 % (ref 39.0–52.0)
Hemoglobin: 16.1 g/dL (ref 13.0–17.0)
Immature Granulocytes: 0 %
Lymphocytes Relative: 29 %
Lymphs Abs: 2 10*3/uL (ref 0.7–4.0)
MCH: 32.2 pg (ref 26.0–34.0)
MCHC: 35.4 g/dL (ref 30.0–36.0)
MCV: 91 fL (ref 80.0–100.0)
Monocytes Absolute: 0.5 10*3/uL (ref 0.1–1.0)
Monocytes Relative: 8 %
Neutro Abs: 4.2 10*3/uL (ref 1.7–7.7)
Neutrophils Relative %: 60 %
Platelets: 236 10*3/uL (ref 150–400)
RBC: 5 MIL/uL (ref 4.22–5.81)
RDW: 12.2 % (ref 11.5–15.5)
WBC: 6.8 10*3/uL (ref 4.0–10.5)
nRBC: 0 % (ref 0.0–0.2)

## 2023-01-31 LAB — LIPASE, BLOOD: Lipase: 30 U/L (ref 11–51)

## 2023-01-31 MED ORDER — OMEPRAZOLE 20 MG PO CPDR: 20.0000 mg | DELAYED_RELEASE_CAPSULE | Freq: Every day | ORAL | 1 refills | Status: DC

## 2023-01-31 MED ORDER — IBUPROFEN 600 MG PO TABS: 600.0000 mg | ORAL_TABLET | Freq: Four times a day (QID) | ORAL | 0 refills | Status: DC | PRN

## 2023-01-31 MED ORDER — BACLOFEN 10 MG PO TABS: 10.0000 mg | ORAL_TABLET | Freq: Three times a day (TID) | ORAL | 0 refills | Status: DC

## 2023-01-31 NOTE — ED Provider Notes (Signed)
[facility]    CSN: [id number] Arrival date & time: 01/31/23  1411      History   Chief Complaint Chief Complaint  Patient presents with   Tinnitus   Gastroesophageal Reflux   Abdominal Pain    HPI Jacob Mckinney is a 42 y.o. male.   HPI  42 year old male with past medical history of GERD, gastritis, tobacco use, and vaping presents for evaluation of multiple complaints.  Patient's first complaint is that he has been experiencing ringing in both of his ears for months.  The right ear is worse than the left and he states that sometimes he feels like his right ear is swollen.  He reports that over the last week he has been experiencing intermittent headaches on the right side of his head.  He is also been experiencing pain in both flanks for the last week.  The pain is worse with movement.  He also has been experiencing foul-smelling urine and states that he has had episodes of being choked while eating food.  That started 5 days ago.  He also has intermittent numbness in all of his extremities and he is currently experiencing numbness in his left leg.  He also is intermittent dizziness and room spinning when he changes position.  Patient does have an appointment with a new PCP on August 1.  History reviewed. No pertinent past medical history.  There are no problems to display for this patient.   History reviewed. No pertinent surgical history.     Home Medications    Prior to Admission medications   Medication Sig Start Date End Date Taking? Authorizing Provider  baclofen (LIORESAL) 10 MG tablet Take 1 tablet (10 mg total) by mouth 3 (three) times daily. 01/31/23  Yes Becky Augusta, NP  ibuprofen (ADVIL) 600 MG tablet Take 1 tablet (600 mg total) by mouth every 6 (six) hours as needed. 01/31/23  Yes Becky Augusta, NP  omeprazole (PRILOSEC) 20 MG capsule Take 1 capsule (20 mg total) by mouth daily. 01/31/23  Yes Becky Augusta, NP  hydrOXYzine (ATARAX) 50 MG tablet  Take 1 tablet (50 mg total) by mouth 3 (three) times daily as needed for anxiety. 09/30/22   Dionne Bucy, MD  hydrOXYzine (VISTARIL) 100 MG capsule Take 1 capsule (100 mg total) by mouth 3 (three) times daily as needed for anxiety. 10/27/21   Merwyn Katos, MD  meclizine (ANTIVERT) 25 MG tablet Take 1 tablet (25 mg total) by mouth 3 (three) times daily as needed for dizziness. 09/30/22   Dionne Bucy, MD    Family History History reviewed. No pertinent family history.  Social History Social History   Tobacco Use   Smoking status: Former    Types: Cigarettes   Smokeless tobacco: Never  Vaping Use   Vaping Use: Every day  Substance Use Topics   Alcohol use: No   Drug use: Not Currently    Types: Cocaine, Methamphetamines     Allergies   Penicillins   Review of Systems Review of Systems  Constitutional:  Negative for fever.  HENT:  Positive for tinnitus.   Gastrointestinal:  Negative for abdominal pain.       Episodes of being choked while eating.  Genitourinary:  Positive for flank pain. Negative for dysuria, frequency, hematuria, penile discharge and urgency.       Foul-smelling urine.  Musculoskeletal:  Positive for back pain.  Neurological:  Positive for dizziness.     Physical Exam Triage Vital Signs ED  Triage Vitals  Enc Vitals Group     BP 01/31/23 1618 (!) 134/90     Pulse Rate 01/31/23 1618 67     Resp 01/31/23 1618 18     Temp 01/31/23 1618 97.7 F (36.5 C)     Temp Source 01/31/23 1618 Oral     SpO2 01/31/23 1618 98 %     Weight --      Height --      Head Circumference --      Peak Flow --      Pain Score 01/31/23 1617 8     Pain Loc --      Pain Edu? --      Excl. in [place]? --    No data found.  Updated Vital Signs BP (!) 134/90 (BP Location: Right Arm)   Pulse 67   Temp 97.7 F (36.5 C) (Oral)   Resp 18   SpO2 98%   Visual Acuity Right Eye Distance:   Left Eye Distance:   Bilateral Distance:    Right Eye Near:   Left Eye  Near:    Bilateral Near:     Physical Exam Vitals and nursing note reviewed.  Constitutional:      Appearance: Normal appearance. He is not ill-appearing.  HENT:     Head: Normocephalic and atraumatic.     Right Ear: Tympanic membrane, ear canal and external ear normal. There is no impacted cerumen.     Left Ear: Tympanic membrane, ear canal and external ear normal. There is no impacted cerumen.     Mouth/Throat:     Mouth: Mucous membranes are moist.     Pharynx: Oropharynx is clear. Posterior oropharyngeal erythema present. No oropharyngeal exudate.     Comments: Mild erythema to the posterior oropharynx. Cardiovascular:     Rate and Rhythm: Normal rate and regular rhythm.     Pulses: Normal pulses.     Heart sounds: Normal heart sounds. No murmur heard.    No friction rub. No gallop.  Pulmonary:     Effort: Pulmonary effort is normal.     Breath sounds: Normal breath sounds. No wheezing, rhonchi or rales.  Abdominal:     General: Abdomen is flat.     Palpations: Abdomen is soft.     Tenderness: There is abdominal tenderness. There is no right CVA tenderness, left CVA tenderness, guarding or rebound.  Musculoskeletal:     Cervical back: Normal range of motion and neck supple.  Lymphadenopathy:     Cervical: No cervical adenopathy.  Skin:    General: Skin is warm and dry.     Capillary Refill: Capillary refill takes less than 2 seconds.     Findings: No rash.  Neurological:     General: No focal deficit present.     Mental Status: He is alert and oriented to person, place, and time.      UC Treatments / Results  Labs (all labs ordered are listed, but only abnormal results are displayed) Labs Reviewed  URINALYSIS, W/ REFLEX TO CULTURE (INFECTION SUSPECTED) - Abnormal; Notable for the following components:      Result Value   Bacteria, UA RARE (*)    All other components within normal limits  CBC WITH DIFFERENTIAL/PLATELET  COMPREHENSIVE METABOLIC PANEL  LIPASE,  BLOOD    EKG   Radiology No results found.  Procedures Procedures (including critical care time)  Medications Ordered in UC Medications - No data to display  Initial Impression / Assessment  and Plan / UC Course  I have reviewed the triage vital signs and the nursing notes.  Pertinent labs & imaging results that were available during my care of the patient were reviewed by me and considered in my medical decision making (see chart for details).   Patient is a nontoxic-appearing 42 year old male presenting for evaluation with complaints as outlined HPI above.  On exam patient has vertigo tympanic membrane's bilaterally with normal light reflex.  No effusions noted.  Both external auditory canals are unremarkable.  Oropharyngeal exam does reveal mild erythema to the posterior oropharynx but no injection or exudate.  No lymphoid follicles.  No cervical lymphadenopathy present on exam.  Cardiopulmonary exam is benign.  Abdomen is soft, flat, with mild left upper quadrant tenderness.  No CVA tenderness on exam.  Patient was recently worked up in the emergency department at [facility] where he was diagnosed with GERD and discharged on omeprazole.  He is not taking omeprazole currently as he finished all of the medication.  The patient has no CVA tenderness on exam.  He does complain of pain in bilateral flanks that was activated by movement.  In particular when I had the patient lay back in the stretcher to examine his abdomen.  He does have some mild left upper quadrant tenderness but no guarding or rebound.  The rest of his abdomen is benign.  I suspect that the patient has GERD with some degree of esophagitis given that he has experienced choking while eating.  He is able to drink and pass water at present so I do not believe a food bolus is present.  I will check a CBC to look for any signs of anemia or presence of systemic infection, CMP to evaluate electrolytes, renal function, and liver  function.  I will also order a lipase to evaluate for presence of pancreatitis.  Given that he is [place] foul-smelling urine I will also order UA to evaluate for the presence of UTI.  The patient's flank pain is more musculoskeletal based upon the fact that it is triggered by movement.  The patient does not have any evidence of ear infection or serous effusion that would explain the tinnitus and the intermittent dizziness.  I suspect it is more of a result of sensorineural hearing loss and we will have him keep his appointment with his new PCP on August 1 to obtain referrals to GI, ENT, and audiology.  CBC is completely unremarkable.  CMP is completely unremarkable.  Lipase is normal at 30.  Urinalysis is completely unremarkable.  I will discharge patient on the diagnosis of GERD with esophagitis and restart him on his omeprazole 20 mg daily.  I will also treat him for musculoskeletal back pain with ibuprofen and baclofen.  He has a new patient appointment with a new PCP on August 1 and I will have him follow-up and keep that appointment to discuss referral to ENT to evaluate patient's tinnitus and also GI to evaluate patient's reflux disease.   Final Clinical Impressions(s) / UC Diagnoses   Final diagnoses:  Gastroesophageal reflux disease with esophagitis without hemorrhage  Acute bilateral thoracic back pain  Tinnitus of both ears     Discharge Instructions      Your blood work and urinalysis were all completely negative.  I believe your flank pain is result of musculoskeletal inflammation in your back.  Take the ibuprofen 600 mg every 6 hours with food as needed for muscle pain.  Take the baclofen 10  mg every 8 hours as needed for muscle tension.  Take both medications on a schedule for the next 1 to 2 days to help with your back pain and see if this alleviates or at least improves her symptoms.  You also have reflux disease and you need to follow-up with gastroenterology.   When you have your new provider appointment on August 1 you need to discuss these ongoing issues with your provider and request a referral to gastroenterology.  I want you to restart your omeprazole 20 mg daily to help with your reflux disease.  I have also given you a list of food choices to help with your GI symptoms.  When you see your new primary care provider on August 1 I would also discussed the ringing in your ears, this is called tinnitus, and request a referral to audiology for a hearing exam to evaluate for hearing loss.  I would also request a referral to ENT for workup and evaluation for possible vertigo.  If you develop any intense, constant room spinning, especially if coupled with visual disturbances or nausea and vomiting, you need to go to the ER for evaluation.     ED Prescriptions     Medication Sig Dispense Auth. Provider   omeprazole (PRILOSEC) 20 MG capsule Take 1 capsule (20 mg total) by mouth daily. 30 capsule Becky Augusta, NP   ibuprofen (ADVIL) 600 MG tablet Take 1 tablet (600 mg total) by mouth every 6 (six) hours as needed. 30 tablet Becky Augusta, NP   baclofen (LIORESAL) 10 MG tablet Take 1 tablet (10 mg total) by mouth 3 (three) times daily. 30 each Becky Augusta, NP      PDMP not reviewed this encounter.   Becky Augusta, NP 01/31/23 1735

## 2023-01-31 NOTE — Discharge Instructions (Addendum)
Your blood work and urinalysis were all completely negative.  I believe your flank pain is result of musculoskeletal inflammation in your back.  Take the ibuprofen 600 mg every 6 hours with food as needed for muscle pain.  Take the baclofen 10 mg every 8 hours as needed for muscle tension.  Take both medications on a schedule for the next 1 to 2 days to help with your back pain and see if this alleviates or at least improves her symptoms.  You also have reflux disease and you need to follow-up with gastroenterology.  When you have your new provider appointment on August 1 you need to discuss these ongoing issues with your provider and request a referral to gastroenterology.  I want you to restart your omeprazole 20 mg daily to help with your reflux disease.  I have also given you a list of food choices to help with your GI symptoms.  When you see your new primary care provider on August 1 I would also discussed the ringing in your ears, this is called tinnitus, and request a referral to audiology for a hearing exam to evaluate for hearing loss.  I would also request a referral to ENT for workup and evaluation for possible vertigo.  If you develop any intense, constant room spinning, especially if coupled with visual disturbances or nausea and vomiting, you need to go to the ER for evaluation.

## 2023-01-31 NOTE — ED Triage Notes (Signed)
Pt c/o ear ringing for several months. He also c/o abdominal pain and foul smelling urine x 1 week.

## 2023-02-24 ENCOUNTER — Encounter: Payer: Self-pay | Admitting: Physician Assistant

## 2023-02-24 ENCOUNTER — Ambulatory Visit (INDEPENDENT_AMBULATORY_CARE_PROVIDER_SITE_OTHER): Payer: Medicaid Other | Admitting: Physician Assistant

## 2023-02-24 VITALS — BP 154/102 | HR 69 | Ht 74.0 in | Wt 212.0 lb

## 2023-02-24 DIAGNOSIS — I10 Essential (primary) hypertension: Secondary | ICD-10-CM | POA: Diagnosis not present

## 2023-02-24 DIAGNOSIS — R8290 Unspecified abnormal findings in urine: Secondary | ICD-10-CM | POA: Diagnosis not present

## 2023-02-24 DIAGNOSIS — R42 Dizziness and giddiness: Secondary | ICD-10-CM | POA: Diagnosis not present

## 2023-02-24 DIAGNOSIS — Z720 Tobacco use: Secondary | ICD-10-CM | POA: Insufficient documentation

## 2023-02-24 DIAGNOSIS — J302 Other seasonal allergic rhinitis: Secondary | ICD-10-CM

## 2023-02-24 DIAGNOSIS — R4589 Other symptoms and signs involving emotional state: Secondary | ICD-10-CM

## 2023-02-24 DIAGNOSIS — K2100 Gastro-esophageal reflux disease with esophagitis, without bleeding: Secondary | ICD-10-CM | POA: Insufficient documentation

## 2023-02-24 DIAGNOSIS — K219 Gastro-esophageal reflux disease without esophagitis: Secondary | ICD-10-CM | POA: Diagnosis not present

## 2023-02-24 DIAGNOSIS — R0602 Shortness of breath: Secondary | ICD-10-CM

## 2023-02-24 DIAGNOSIS — Z87898 Personal history of other specified conditions: Secondary | ICD-10-CM | POA: Diagnosis not present

## 2023-02-24 DIAGNOSIS — Z419 Encounter for procedure for purposes other than remedying health state, unspecified: Secondary | ICD-10-CM | POA: Diagnosis not present

## 2023-02-24 LAB — POCT URINALYSIS DIPSTICK
Bilirubin, UA: NEGATIVE
Blood, UA: NEGATIVE
Glucose, UA: NEGATIVE
Ketones, UA: NEGATIVE
Leukocytes, UA: NEGATIVE
Nitrite, UA: NEGATIVE
Protein, UA: NEGATIVE
Spec Grav, UA: 1.015 (ref 1.010–1.025)
Urobilinogen, UA: 0.2 E.U./dL
pH, UA: 7 (ref 5.0–8.0)

## 2023-02-24 MED ORDER — HYDROCHLOROTHIAZIDE 25 MG PO TABS
25.0000 mg | ORAL_TABLET | Freq: Every day | ORAL | 1 refills | Status: DC
Start: 2023-02-24 — End: 2023-04-08

## 2023-02-24 MED ORDER — FLUTICASONE PROPIONATE 50 MCG/ACT NA SUSP: 2.0000 | Freq: Every day | NASAL | 6 refills | Status: DC

## 2023-02-24 MED ORDER — OMEPRAZOLE 20 MG PO CPDR: 20.0000 mg | DELAYED_RELEASE_CAPSULE | Freq: Two times a day (BID) | ORAL | 2 refills | Status: DC

## 2023-02-24 MED ORDER — SERTRALINE HCL 50 MG PO TABS: 50.0000 mg | ORAL_TABLET | Freq: Every day | ORAL | 1 refills | Status: DC

## 2023-02-24 NOTE — Progress Notes (Signed)
Date:  02/24/2023   Name:  Jacob Mckinney   DOB:  Jan 19, 1981   MRN:  [id number]   Chief Complaint: Establish Care, Shortness of Breath (X4 months, getting worse, went to UC and ED requesting referrals to specialists, has had lung test, feels like air pocket is in throat, gasps for air, hears wheezing.  When he walks he sees stars like he's blacking out, also sees stars when blowing nose /), urinary odor (Urine smells really bad, urine was tested in UC and normal.), Anxiety (Very nervous about health issues and what they could mean. Anxious to get answers. Interfering with daily life. ), and Gastroesophageal Reflux (Epigastric pain, omeprazole not helping)  HPI [name] is a very pleasant 42 year old male in relatively good health with a history of prior polysubstance abuse (cocaine, amphetamine, tobacco) and alcohol abuse new to the practice today to establish care and address numerous complaints.  Chart review shows the following: --ED visit 10/27/2021 for chest pain and severe anxiety thought to be related to illicit stimulant use, workup unremarkable.  BP 137/122 --ED visit 09/30/2022 for lower left sharp intermittent chest pain, complaints of orthostasis, intermittent numbness of the upper extremities.  BP 149/98.  Pain felt to be musculoskeletal with reassuring troponin and EKG.  CT head normal. --ED visit 10/17/2022 again for upper abdominal/chest pain and dysphagia, vertigo, numbness of arms and legs without weakness, mild shortness of breath.  Physical exam with epigastric tenderness.  Again workup normal including CBC, CMP, mag, lipase, CXR, EKG, troponin, and viral testing.  BP 137/98 --UC visit 01/31/2023 with complaints of tinnitus R>L, vertigo, intermittent headache, MSK flank pain, malodorous urine, dysphagia to food, intermittent numbness all 4 extremities.  BP 134/90.  Once again workup unremarkable, refilled omeprazole.  Today he continues to complain of: GERD,  dysphagia - not improved with omeprazole  Shortness of breath - intermittent, not always exertional, negative cardiac workup to-date, still vaping but quit smoking 3 months ago Vertigo - episodic dizziness and tinnitus. Has known allergies in summer, takes allergy pill Anxiety - very anxious about his many health complaints. Just wants to feel better. The anxiety is itself becoming a problem.  Malodorous urine - states he has bright yellow urine, frequency, and foul urinary odor, but mainly drinks water throughout the day estimating 6-7 bottles of water daily.     Medication list has been reviewed and updated.  Current Meds  Medication Sig   fluticasone (FLONASE) 50 MCG/ACT nasal spray Place 2 sprays into both nostrils daily. Prime the bottle before first use. First week use 2 sprays each nostril once daily. Tilt head forward slightly, angle nozzle slightly away from midline, gently inhale as you spray.   hydrochlorothiazide (HYDRODIURIL) 25 MG tablet Take 1 tablet (25 mg total) by mouth daily.   sertraline (ZOLOFT) 50 MG tablet Take 1 tablet (50 mg total) by mouth daily.   [DISCONTINUED] omeprazole (PRILOSEC) 20 MG capsule Take 1 capsule (20 mg total) by mouth daily.     Review of Systems  Constitutional:  Positive for fatigue. Negative for fever.  HENT:  Positive for tinnitus and trouble swallowing.   Eyes:  Positive for visual disturbance (stars in vision at times).  Respiratory:  Positive for shortness of breath and wheezing. Negative for cough and chest tightness.   Cardiovascular:  Positive for chest pain (intermittent, bilateral). Negative for palpitations.  Gastrointestinal:  Positive for abdominal pain (epigastric). Negative for blood in stool.  Genitourinary:  Positive for frequency.  Negative for difficulty urinating, dysuria, hematuria, penile discharge and testicular pain.       Malodorous bright yellow urine  Neurological:  Positive for dizziness.  Psychiatric/Behavioral:   Positive for decreased concentration, dysphoric mood and sleep disturbance. The patient is nervous/anxious.     Patient Active Problem List   Diagnosis Date Noted   Anxiety about health 02/24/2023   Vapes nicotine containing substance 02/24/2023   Primary hypertension 02/24/2023   Gastroesophageal reflux disease 02/24/2023   Seasonal allergic rhinitis 02/24/2023    Allergies  Allergen Reactions   Penicillins Rash and Other (See Comments)    Patient unsure, just knows he is allergic     There is no immunization history on file for this patient.  History reviewed. No pertinent surgical history.  Social History   Tobacco Use   Smoking status: Former    Current packs/day: 0.00    Average packs/day: 1.5 packs/day for 24.7 years (37.1 ttl pk-yrs)    Types: Cigarettes    Start date: 02/23/1998    Quit date: 11/24/2022    Years since quitting: 0.2   Smokeless tobacco: Never  Vaping Use   Vaping status: Every Day   Substances: Nicotine  Substance Use Topics   Alcohol use: No   Drug use: Not Currently    Types: Cocaine, Methamphetamines    Family History  Problem Relation Age of Onset   Heart disease Maternal Grandfather    Heart attack Maternal Grandfather         02/24/2023    9:55 AM  GAD 7 : Generalized Anxiety Score  Nervous, Anxious, on Edge 3  Control/stop worrying 3  Worry too much - different things 3  Trouble relaxing 3  Restless 3  Easily annoyed or irritable 3  Afraid - awful might happen 3  Total GAD 7 Score 21  Anxiety Difficulty Extremely difficult       02/24/2023    9:54 AM  Depression screen PHQ 2/9  Decreased Interest 1  Down, Depressed, Hopeless 2  PHQ - 2 Score 3  Altered sleeping 2  Tired, decreased energy 3  Change in appetite 3  Feeling bad or failure about yourself  0  Trouble concentrating 2  Moving slowly or fidgety/restless 2  Suicidal thoughts 0  PHQ-9 Score 15  Difficult doing work/chores Not difficult at all    BP Readings  from Last 3 Encounters:  02/24/23 (!) 154/102  01/31/23 (!) 134/90  09/30/22 (!) 123/94    Wt Readings from Last 3 Encounters:  02/24/23 212 lb (96.2 kg)  09/30/22 190 lb (86.2 kg)  06/28/19 200 lb (90.7 kg)    BP (!) 154/102 (BP Location: Left Arm, Patient Position: Sitting, Cuff Size: Large)   Pulse 69   Ht 6\' 2"  (1.88 m)   Wt 212 lb (96.2 kg)   SpO2 97%   BMI 27.22 kg/m   Physical Exam Vitals and nursing note reviewed.  Constitutional:      General: He is not in acute distress.    Appearance: Normal appearance.  HENT:     Right Ear: Tympanic membrane normal.     Left Ear: Tympanic membrane normal.     Ears:     Comments: EAC clear bilaterally with good view of TM which is without effusion or erythema.     Nose: Nose normal.     Comments: Sinuses nontender    Mouth/Throat:     Mouth: Mucous membranes are moist.     Pharynx: No  oropharyngeal exudate or posterior oropharyngeal erythema.  Eyes:     Pupils: Pupils are equal, round, and reactive to light.     Comments: Palpebral conjunctiva injected bilaterally.  Neck:     Vascular: No carotid bruit.  Cardiovascular:     Rate and Rhythm: Normal rate and regular rhythm.     Heart sounds: No murmur heard.    No friction rub. No gallop.  Pulmonary:     Effort: Pulmonary effort is normal.     Breath sounds: Normal breath sounds. Decreased air movement (borderline) present. No wheezing, rhonchi or rales.  Abdominal:     General: Bowel sounds are normal. There is no distension.     Palpations: Abdomen is soft. There is no hepatomegaly.     Tenderness: There is abdominal tenderness in the epigastric area.     Hernia: No hernia is present.  Musculoskeletal:        General: Normal range of motion.  Lymphadenopathy:     Cervical: No cervical adenopathy.  Skin:    General: Skin is warm and dry.  Neurological:     Mental Status: He is alert and oriented to person, place, and time.     Gait: Gait is intact.  Psychiatric:         Mood and Affect: Affect normal. Mood is anxious.     Urine Dipstick   Recent Labs     Component Value Date/Time   NA 136 01/31/2023 1644   NA 138 08/31/2014 2112   K 4.1 01/31/2023 1644   K 3.8 08/31/2014 2112   CL 102 01/31/2023 1644   CL 105 08/31/2014 2112   CO2 27 01/31/2023 1644   CO2 28 08/31/2014 2112   GLUCOSE 84 01/31/2023 1644   GLUCOSE 99 08/31/2014 2112   BUN 11 01/31/2023 1644   BUN 12 08/31/2014 2112   CREATININE 0.90 01/31/2023 1644   CREATININE 1.17 08/31/2014 2112   CALCIUM 9.1 01/31/2023 1644   CALCIUM 8.9 08/31/2014 2112   PROT 7.8 01/31/2023 1644   ALBUMIN 4.6 01/31/2023 1644   AST 20 01/31/2023 1644   ALT 22 01/31/2023 1644   ALKPHOS 66 01/31/2023 1644   BILITOT 0.4 01/31/2023 1644   GFRNONAA >60 01/31/2023 1644   GFRNONAA >60 08/31/2014 2112   GFRAA >60 06/28/2019 1116   GFRAA >60 08/31/2014 2112    Lab Results  Component Value Date   WBC 6.8 01/31/2023   HGB 16.1 01/31/2023   HCT 45.5 01/31/2023   MCV 91.0 01/31/2023   PLT 236 01/31/2023   No results found for: "HGBA1C" No results found for: "CHOL", "HDL", "LDLCALC", "LDLDIRECT", "TRIG", "CHOLHDL" Lab Results  Component Value Date   TSH 1.028 09/30/2022     Assessment and Plan:  1. Gastroesophageal reflux disease, unspecified whether esophagitis present I agree with prior diagnosis of probable GERD, perhaps complicated by some degree of esophagitis.  Increase omeprazole to twice daily dosing.  Consider also possibility of gastric/peptic ulcer.  Refer to GI for further evaluation and workup.  - omeprazole (PRILOSEC) 20 MG capsule; Take 1 capsule (20 mg total) by mouth 2 (two) times daily before a meal.  Dispense: 60 capsule; Refill: 2 - Ambulatory referral to Gastroenterology  2. Primary hypertension Upon thorough chart review I cannot find any normotensive measurements at any point in recent years.  We will begin treatment with HCTZ, which was chosen for potential added  benefit for inner ear fluid/Mnire disease - hydrochlorothiazide (HYDRODIURIL) 25 MG tablet; Take 1 tablet (  25 mg total) by mouth daily.  Dispense: 30 tablet; Refill: 1  3. Vertigo Unclear etiology at this time, TMs without effusion today.  With concurrent tinnitus, some suspicion for Mnire disease.  Could also be eustachian tube dysfunction from inadequately controlled allergic rhinitis.  Refer to ENT for additional evaluation. - Ambulatory referral to ENT  4. Seasonal allergic rhinitis, unspecified trigger Start intranasal corticosteroid for allergic rhinitis and possible eustachian tube dysfunction.  Continue OTC daily antihistamine. - fluticasone (FLONASE) 50 MCG/ACT nasal spray; Place 2 sprays into both nostrils daily. Prime the bottle before first use. First week use 2 sprays each nostril once daily. Tilt head forward slightly, angle nozzle slightly away from midline, gently inhale as you spray.  Dispense: 16 g; Refill: 6 - Ambulatory referral to ENT  5. Anxiety about health Patient would like pharmacotherapy for his anxiety.  Will begin with sertraline 50 mg daily.  I have advised him to start his blood pressure medicine first for several days evaluating for side effects, then start sertraline. - sertraline (ZOLOFT) 50 MG tablet; Take 1 tablet (50 mg total) by mouth daily.  Dispense: 30 tablet; Refill: 1  6. Shortness of breath Negative cardiopulmonary workup to-date.  Oxygen 97% today, perhaps some mildly decreased air movement in the lungs.  Encouraged cessation of vape and continued avoidance of cigarettes  7. Vapes nicotine containing substance Encouraged cessation of vape and continued avoidance of cigarettes  8. Foul smelling urine Curiously, his urine is once again clear on today's dipstick.  Recent CMP normal as well.  Will continue to monitor for now - POCT urinalysis dipstick  9. History of substance use disorder Encouraged continued avoidance of illicit  substances    This patient presented with numerous ongoing complaints which require close follow-up in the near future  Return in about 2 weeks (around 03/10/2023) for OV f/u HTN, anx, GERD. Check Vit D, B12, B9 next time   Alvester Morin, PA-C, DMSc, Nutritionist [facility] [phone]

## 2023-03-01 ENCOUNTER — Telehealth: Payer: Self-pay | Admitting: Physician Assistant

## 2023-03-01 NOTE — Telephone Encounter (Signed)
Tried calling pt to inform I moved his appt to 4pm that day. No answer, and could not leave VM.  - Monette Omara

## 2023-03-02 NOTE — Telephone Encounter (Signed)
2nd attempt. Tried calling patient again. No answer and could not leave VM.  - Jacob Mckinney

## 2023-03-07 NOTE — Telephone Encounter (Signed)
Pt returned missed calls. Advised pt his appointment was changed to 4:00 on 08/15. Pt verbalized understanding.

## 2023-03-10 ENCOUNTER — Ambulatory Visit: Payer: Medicaid Other | Admitting: Physician Assistant

## 2023-03-10 ENCOUNTER — Ambulatory Visit (INDEPENDENT_AMBULATORY_CARE_PROVIDER_SITE_OTHER): Payer: Medicaid Other | Admitting: Physician Assistant

## 2023-03-10 ENCOUNTER — Encounter: Payer: Self-pay | Admitting: Physician Assistant

## 2023-03-10 VITALS — BP 138/92 | HR 60 | Temp 98.3°F | Ht 74.0 in | Wt 210.0 lb

## 2023-03-10 DIAGNOSIS — Z1321 Encounter for screening for nutritional disorder: Secondary | ICD-10-CM

## 2023-03-10 DIAGNOSIS — I10 Essential (primary) hypertension: Secondary | ICD-10-CM | POA: Diagnosis not present

## 2023-03-10 DIAGNOSIS — R4589 Other symptoms and signs involving emotional state: Secondary | ICD-10-CM

## 2023-03-10 DIAGNOSIS — F410 Panic disorder [episodic paroxysmal anxiety] without agoraphobia: Secondary | ICD-10-CM | POA: Diagnosis not present

## 2023-03-10 DIAGNOSIS — R5383 Other fatigue: Secondary | ICD-10-CM | POA: Diagnosis not present

## 2023-03-10 DIAGNOSIS — F332 Major depressive disorder, recurrent severe without psychotic features: Secondary | ICD-10-CM | POA: Insufficient documentation

## 2023-03-10 DIAGNOSIS — F411 Generalized anxiety disorder: Secondary | ICD-10-CM

## 2023-03-10 DIAGNOSIS — Z23 Encounter for immunization: Secondary | ICD-10-CM | POA: Diagnosis not present

## 2023-03-10 MED ORDER — SERTRALINE HCL 100 MG PO TABS
100.0000 mg | ORAL_TABLET | Freq: Every day | ORAL | 2 refills | Status: DC
Start: 2023-03-10 — End: 2023-04-08

## 2023-03-10 MED ORDER — HYDROXYZINE HCL 25 MG PO TABS
25.0000 mg | ORAL_TABLET | Freq: Three times a day (TID) | ORAL | 1 refills | Status: DC | PRN
Start: 2023-03-10 — End: 2023-05-10

## 2023-03-10 MED ORDER — LOSARTAN POTASSIUM 25 MG PO TABS
25.0000 mg | ORAL_TABLET | Freq: Every day | ORAL | 1 refills | Status: DC
Start: 2023-03-10 — End: 2023-04-08

## 2023-03-10 NOTE — Progress Notes (Signed)
Date:  03/10/2023   Name:  Jacob Mckinney   DOB:  [birth year]   MRN:  [id number]   Chief Complaint: Hypertension, Anxiety, and Gastroesophageal Reflux  HPI Genelle Bal presents for 2-week follow-up after last visit with me 02/24/2023.  We started HCTZ for blood pressure, fluticasone for allergies/ETD, and sertraline for anxiety.  We also increased his omeprazole dosing to twice daily due to uncontrolled GERD, which has resulted in some degree of symptomatic improvement.  Referrals were initiated for GI (sched 04/20/23) and ENT (appt not yet made).  Unfortunately the sertraline does not seem to have helped much with his anxiety/depression. He describes what sounds like panic attacks, reporting shortness of breath, dizziness, and seeing stars mainly at work when stressed.  Planning for BMP, vitamin D, B12, B9 today   Medication list has been reviewed and updated.  Current Meds  Medication Sig   fluticasone (FLONASE) 50 MCG/ACT nasal spray Place 2 sprays into both nostrils daily. Prime the bottle before first use. First week use 2 sprays each nostril once daily. Tilt head forward slightly, angle nozzle slightly away from midline, gently inhale as you spray.   hydrochlorothiazide (HYDRODIURIL) 25 MG tablet Take 1 tablet (25 mg total) by mouth daily.   omeprazole (PRILOSEC) 20 MG capsule Take 1 capsule (20 mg total) by mouth 2 (two) times daily before a meal.   sertraline (ZOLOFT) 50 MG tablet Take 1 tablet (50 mg total) by mouth daily.     Review of Systems  Constitutional:  Positive for fatigue. Negative for fever.  HENT:  Positive for tinnitus and trouble swallowing.   Eyes:  Positive for visual disturbance (stars in vision at times).  Respiratory:  Positive for shortness of breath and wheezing. Negative for cough and chest tightness.   Cardiovascular:  Positive for chest pain (intermittent, bilateral). Negative for palpitations.  Gastrointestinal:  Positive for abdominal pain  (epigastric). Negative for blood in stool.  Genitourinary:  Positive for frequency. Negative for difficulty urinating, dysuria, hematuria, penile discharge and testicular pain.       Malodorous bright yellow urine  Neurological:  Positive for dizziness.  Psychiatric/Behavioral:  Positive for decreased concentration, dysphoric mood and sleep disturbance. The patient is nervous/anxious.     Patient Active Problem List   Diagnosis Date Noted   Anxiety about health 02/24/2023   Vapes nicotine containing substance 02/24/2023   Primary hypertension 02/24/2023   Gastroesophageal reflux disease 02/24/2023   Seasonal allergic rhinitis 02/24/2023   History of substance use disorder 02/24/2023   Shortness of breath 02/24/2023   Foul smelling urine 02/24/2023   Vertigo 02/24/2023    Allergies  Allergen Reactions   Penicillins Rash and Other (See Comments)    Patient unsure, just knows he is allergic     There is no immunization history on file for this patient.  History reviewed. No pertinent surgical history.  Social History   Tobacco Use   Smoking status: Former    Current packs/day: 0.00    Average packs/day: 1.5 packs/day for 24.7 years (37.1 ttl pk-yrs)    Types: Cigarettes    Start date: 02/23/1998    Quit date: 11/24/2022    Years since quitting: 0.2   Smokeless tobacco: Never  Vaping Use   Vaping status: Every Day   Substances: Nicotine  Substance Use Topics   Alcohol use: No   Drug use: Not Currently    Types: Cocaine, Methamphetamines    Family History  Problem Relation Age of Onset  Heart disease Maternal Grandfather    Heart attack Maternal Grandfather         03/10/2023    4:07 PM 02/24/2023    9:55 AM  GAD 7 : Generalized Anxiety Score  Nervous, Anxious, on Edge 3 3  Control/stop worrying 3 3  Worry too much - different things 3 3  Trouble relaxing 3 3  Restless 3 3  Easily annoyed or irritable 3 3  Afraid - awful might happen 3 3  Total GAD 7 Score 21  21  Anxiety Difficulty Extremely difficult Extremely difficult       03/10/2023    4:06 PM 02/24/2023    9:54 AM  Depression screen PHQ 2/9  Decreased Interest 1 1  Down, Depressed, Hopeless 1 2  PHQ - 2 Score 2 3  Altered sleeping 3 2  Tired, decreased energy 3 3  Change in appetite 3 3  Feeling bad or failure about yourself  1 0  Trouble concentrating 3 2  Moving slowly or fidgety/restless 3 2  Suicidal thoughts 0 0  PHQ-9 Score 18 15  Difficult doing work/chores Not difficult at all Not difficult at all    BP Readings from Last 3 Encounters:  03/10/23 134/82  02/24/23 (!) 154/102  01/31/23 (!) 134/90    Wt Readings from Last 3 Encounters:  03/10/23 210 lb (95.3 kg)  02/24/23 212 lb (96.2 kg)  09/30/22 190 lb (86.2 kg)    BP 134/82   Pulse 60   Temp 98.3 F (36.8 C) (Oral)   Ht 6\' 2"  (1.88 m)   Wt 210 lb (95.3 kg)   SpO2 97%   BMI 26.96 kg/m   Physical Exam Vitals and nursing note reviewed.  Constitutional:      Appearance: Normal appearance.  Cardiovascular:     Rate and Rhythm: Normal rate.  Pulmonary:     Effort: Pulmonary effort is normal.  Abdominal:     General: There is no distension.  Musculoskeletal:        General: Normal range of motion.  Skin:    General: Skin is warm and dry.  Neurological:     Mental Status: He is alert and oriented to person, place, and time.     Gait: Gait is intact.  Psychiatric:        Mood and Affect: Mood and affect normal.     Recent Labs     Component Value Date/Time   NA 136 01/31/2023 1644   NA 138 08/31/2014 2112   K 4.1 01/31/2023 1644   K 3.8 08/31/2014 2112   CL 102 01/31/2023 1644   CL 105 08/31/2014 2112   CO2 27 01/31/2023 1644   CO2 28 08/31/2014 2112   GLUCOSE 84 01/31/2023 1644   GLUCOSE 99 08/31/2014 2112   BUN 11 01/31/2023 1644   BUN 12 08/31/2014 2112   CREATININE 0.90 01/31/2023 1644   CREATININE 1.17 08/31/2014 2112   CALCIUM 9.1 01/31/2023 1644   CALCIUM 8.9 08/31/2014 2112    PROT 7.8 01/31/2023 1644   ALBUMIN 4.6 01/31/2023 1644   AST 20 01/31/2023 1644   ALT 22 01/31/2023 1644   ALKPHOS 66 01/31/2023 1644   BILITOT 0.4 01/31/2023 1644   GFRNONAA >60 01/31/2023 1644   GFRNONAA >60 08/31/2014 2112   GFRAA >60 06/28/2019 1116   GFRAA >60 08/31/2014 2112    Lab Results  Component Value Date   WBC 6.8 01/31/2023   HGB 16.1 01/31/2023   HCT 45.5 01/31/2023  MCV 91.0 01/31/2023   PLT 236 01/31/2023   No results found for: "HGBA1C" No results found for: "CHOL", "HDL", "LDLCALC", "LDLDIRECT", "TRIG", "CHOLHDL" Lab Results  Component Value Date   TSH 1.028 09/30/2022     Assessment and Plan:  1. Primary hypertension Inadequate control with HCTZ alone, add losartan.  Check BMP today.  Anxiety may be contributory. - Basic metabolic panel - losartan (COZAAR) 25 MG tablet; Take 1 tablet (25 mg total) by mouth daily.  Dispense: 30 tablet; Refill: 1  2. Generalized anxiety disorder with panic attacks Increase sertraline to 100 mg daily.  Add hydroxyzine for as needed use, discussed with patient this is likely to cause drowsiness, but could be taken up to 3 times daily as needed.  Offered behavioral health referral, but declined. - sertraline (ZOLOFT) 100 MG tablet; Take 1 tablet (100 mg total) by mouth daily.  Dispense: 30 tablet; Refill: 2 - hydrOXYzine (ATARAX) 25 MG tablet; Take 1 tablet (25 mg total) by mouth 3 (three) times daily as needed.  Dispense: 30 tablet; Refill: 1  3. Severe episode of recurrent major depressive disorder, without psychotic features (HCC) Plan as above, also check vitamin D, B12, B9 today - B12 and Folate Panel - VITAMIN D 25 Hydroxy (Vit-D Deficiency, Fractures) - sertraline (ZOLOFT) 100 MG tablet; Take 1 tablet (100 mg total) by mouth daily.  Dispense: 30 tablet; Refill: 2  4. Encounter for vitamin deficiency screening Check for vitamin deficiencies as below - B12 and Folate Panel - VITAMIN D 25 Hydroxy (Vit-D Deficiency,  Fractures)  5. Fatigue, unspecified type Check for vitamin deficiencies as below - B12 and Folate Panel - VITAMIN D 25 Hydroxy (Vit-D Deficiency, Fractures)  6. Anxiety about health Increase sertraline - sertraline (ZOLOFT) 100 MG tablet; Take 1 tablet (100 mg total) by mouth daily.  Dispense: 30 tablet; Refill: 2  7. Need for Tdap vaccination Tdap booster administered today. - Tdap vaccine greater than or equal to 7yo IM    Patient reminded to call ENT to schedule, number given.  Follow-up in about 4 weeks OV chronic conditions   Alvester Morin, PA-C, DMSc, Nutritionist [facility] [phone]

## 2023-03-11 LAB — BASIC METABOLIC PANEL
BUN/Creatinine Ratio: 13 (ref 9–20)
BUN: 15 mg/dL (ref 6–24)
CO2: 25 mmol/L (ref 20–29)
Calcium: 9.7 mg/dL (ref 8.7–10.2)
Chloride: 100 mmol/L (ref 96–106)
Creatinine, Ser: 1.16 mg/dL (ref 0.76–1.27)
Glucose: 83 mg/dL (ref 70–99)
Potassium: 4.1 mmol/L (ref 3.5–5.2)
Sodium: 139 mmol/L (ref 134–144)
eGFR: 80 mL/min/{1.73_m2} (ref >59)

## 2023-03-11 LAB — VITAMIN D 25 HYDROXY (VIT D DEFICIENCY, FRACTURES): Vit D, 25-Hydroxy: 47.7 ng/mL (ref 30.0–100.0)

## 2023-03-11 LAB — B12 AND FOLATE PANEL
Folate: 11.5 ng/mL (ref >3.0)
Vitamin B-12: 334 pg/mL (ref 232–1245)

## 2023-03-15 NOTE — Progress Notes (Signed)
3rd attempt labs printed and mailed.  KP 

## 2023-03-21 DIAGNOSIS — H9313 Tinnitus, bilateral: Secondary | ICD-10-CM | POA: Diagnosis not present

## 2023-03-21 DIAGNOSIS — H8113 Benign paroxysmal vertigo, bilateral: Secondary | ICD-10-CM | POA: Diagnosis not present

## 2023-03-21 DIAGNOSIS — R42 Dizziness and giddiness: Secondary | ICD-10-CM | POA: Diagnosis not present

## 2023-03-21 DIAGNOSIS — J3489 Other specified disorders of nose and nasal sinuses: Secondary | ICD-10-CM | POA: Diagnosis not present

## 2023-03-27 DIAGNOSIS — Z419 Encounter for procedure for purposes other than remedying health state, unspecified: Secondary | ICD-10-CM | POA: Diagnosis not present

## 2023-04-07 ENCOUNTER — Ambulatory Visit: Payer: Medicaid Other | Admitting: Physician Assistant

## 2023-04-08 ENCOUNTER — Encounter: Payer: Self-pay | Admitting: Physician Assistant

## 2023-04-08 ENCOUNTER — Ambulatory Visit (INDEPENDENT_AMBULATORY_CARE_PROVIDER_SITE_OTHER): Payer: Medicaid Other | Admitting: Physician Assistant

## 2023-04-08 VITALS — BP 134/84 | HR 68 | Temp 97.6°F | Ht 74.0 in | Wt 208.0 lb

## 2023-04-08 DIAGNOSIS — G8929 Other chronic pain: Secondary | ICD-10-CM | POA: Diagnosis not present

## 2023-04-08 DIAGNOSIS — F411 Generalized anxiety disorder: Secondary | ICD-10-CM

## 2023-04-08 DIAGNOSIS — F410 Panic disorder [episodic paroxysmal anxiety] without agoraphobia: Secondary | ICD-10-CM | POA: Diagnosis not present

## 2023-04-08 DIAGNOSIS — R4589 Other symptoms and signs involving emotional state: Secondary | ICD-10-CM | POA: Diagnosis not present

## 2023-04-08 DIAGNOSIS — Z23 Encounter for immunization: Secondary | ICD-10-CM

## 2023-04-08 DIAGNOSIS — M5440 Lumbago with sciatica, unspecified side: Secondary | ICD-10-CM | POA: Diagnosis not present

## 2023-04-08 DIAGNOSIS — F332 Major depressive disorder, recurrent severe without psychotic features: Secondary | ICD-10-CM | POA: Diagnosis not present

## 2023-04-08 DIAGNOSIS — I10 Essential (primary) hypertension: Secondary | ICD-10-CM

## 2023-04-08 MED ORDER — LOSARTAN POTASSIUM 50 MG PO TABS: 50.0000 mg | ORAL_TABLET | Freq: Every day | ORAL | 1 refills | Status: DC

## 2023-04-08 MED ORDER — HYDROCHLOROTHIAZIDE 25 MG PO TABS: 25.0000 mg | ORAL_TABLET | Freq: Every day | ORAL | 1 refills | Status: DC

## 2023-04-08 MED ORDER — SERTRALINE HCL 100 MG PO TABS: 100.0000 mg | ORAL_TABLET | Freq: Every day | ORAL | 1 refills | Status: DC

## 2023-04-08 NOTE — Progress Notes (Signed)
Date:  04/08/2023   Name:  Jacob Mckinney   DOB:  1981/05/18   MRN:  [id number]   Chief Complaint: Hypertension and anxiety and depression   HPI Jacob Mckinney returns for 1 month follow-up on HTN and anxiety after our last visit 03/10/2023.  He is very confused about his medications, so is not a reliable historian for this.  He thinks he is taking sertraline daily along with losartan and HCTZ as prescribed.  Possibly using hydroxyzine once per day, unsure.  In a rush to make it to his appointment on time, he did not take his blood pressure medication this morning.  Worked until 3 AM overnight, last dose of antihypertensives was around 11:30 AM yesterday.  Blood pressure is usually 130s/90s at home  Feels like sertraline dose increased last visit has been beneficial for him.  Would like to continue at the current dose.  Continues to complain of tingling in the lower extremities.  Labs have been normal so far including screening for vitamin deficiencies last time.  Says occasionally his lower back will "lock up".  No recent imaging.  Says he had an ENT appointment recently where I referred him for vertigo, allergic rhinitis, and dysphagia.  Was dissatisfied with the exam.  Says the provider just looked in his mouth with a flashlight and told him he was fine; no follow-up or other tests.   Has GI appt 04/27/23 to evaluate dysphagia/GERD   Medication list has been reviewed and updated.  Current Meds  Medication Sig   fluticasone (FLONASE) 50 MCG/ACT nasal spray Place 2 sprays into both nostrils daily. Prime the bottle before first use. First week use 2 sprays each nostril once daily. Tilt head forward slightly, angle nozzle slightly away from midline, gently inhale as you spray.   hydrOXYzine (ATARAX) 25 MG tablet Take 1 tablet (25 mg total) by mouth 3 (three) times daily as needed.   omeprazole (PRILOSEC) 20 MG capsule Take 1 capsule (20 mg total) by mouth 2 (two) times daily before a meal.    [DISCONTINUED] hydrochlorothiazide (HYDRODIURIL) 25 MG tablet Take 1 tablet (25 mg total) by mouth daily.   [DISCONTINUED] losartan (COZAAR) 25 MG tablet Take 1 tablet (25 mg total) by mouth daily.   [DISCONTINUED] sertraline (ZOLOFT) 100 MG tablet Take 1 tablet (100 mg total) by mouth daily.     Review of Systems  Constitutional:  Positive for fatigue. Negative for fever.  Respiratory:  Negative for chest tightness and shortness of breath.   Cardiovascular:  Negative for chest pain and palpitations.  Gastrointestinal:  Negative for abdominal pain.  Musculoskeletal:  Positive for back pain.  Neurological:  Positive for numbness.  Psychiatric/Behavioral:  Positive for decreased concentration, dysphoric mood and sleep disturbance. The patient is nervous/anxious.     Patient Active Problem List   Diagnosis Date Noted   Generalized anxiety disorder with panic attacks 03/10/2023   Severe episode of recurrent major depressive disorder, without psychotic features (HCC) 03/10/2023   Anxiety about health 02/24/2023   Vapes nicotine containing substance 02/24/2023   Primary hypertension 02/24/2023   Gastroesophageal reflux disease 02/24/2023   Seasonal allergic rhinitis 02/24/2023   History of substance use disorder 02/24/2023   Shortness of breath 02/24/2023   Foul smelling urine 02/24/2023   Vertigo 02/24/2023    Allergies  Allergen Reactions   Penicillins Rash and Other (See Comments)    Patient unsure, just knows he is allergic    Immunization History  Administered Date(s) Administered  Influenza, Seasonal, Injecte, Preservative Fre 04/08/2023   Tdap 03/10/2023    History reviewed. No pertinent surgical history.  Social History   Tobacco Use   Smoking status: Former    Current packs/day: 0.00    Average packs/day: 1.5 packs/day for 24.7 years (37.1 ttl pk-yrs)    Types: Cigarettes    Start date: 02/23/1998    Quit date: 11/24/2022    Years since quitting: 0.3   Smokeless  tobacco: Never  Vaping Use   Vaping status: Every Day   Substances: Nicotine  Substance Use Topics   Alcohol use: No   Drug use: Not Currently    Types: Cocaine, Methamphetamines    Family History  Problem Relation Age of Onset   Heart disease Maternal Grandfather    Heart attack Maternal Grandfather         04/08/2023   10:30 AM 03/10/2023    4:07 PM 02/24/2023    9:55 AM  GAD 7 : Generalized Anxiety Score  Nervous, Anxious, on Edge 2 3 3   Control/stop worrying 2 3 3   Worry too much - different things 2 3 3   Trouble relaxing 2 3 3   Restless 2 3 3   Easily annoyed or irritable 1 3 3   Afraid - awful might happen 1 3 3   Total GAD 7 Score 12 21 21   Anxiety Difficulty Somewhat difficult Extremely difficult Extremely difficult       04/08/2023   10:30 AM 03/10/2023    4:06 PM 02/24/2023    9:54 AM  Depression screen PHQ 2/9  Decreased Interest 1 1 1   Down, Depressed, Hopeless 1 1 2   PHQ - 2 Score 2 2 3   Altered sleeping 2 3 2   Tired, decreased energy 2 3 3   Change in appetite 2 3 3   Feeling bad or failure about yourself  1 1 0  Trouble concentrating 2 3 2   Moving slowly or fidgety/restless 2 3 2   Suicidal thoughts 0 0 0  PHQ-9 Score 13 18 15   Difficult doing work/chores Not difficult at all Not difficult at all Not difficult at all    BP Readings from Last 3 Encounters:  04/08/23 134/84  03/10/23 (!) 138/92  02/24/23 (!) 154/102    Wt Readings from Last 3 Encounters:  04/08/23 208 lb (94.3 kg)  03/10/23 210 lb (95.3 kg)  02/24/23 212 lb (96.2 kg)    BP 134/84 (BP Location: Left Arm, Patient Position: Sitting, Cuff Size: Large)   Pulse 68   Temp 97.6 F (36.4 C) (Oral)   Ht 6\' 2"  (1.88 m)   Wt 208 lb (94.3 kg)   SpO2 99%   BMI 26.71 kg/m   Physical Exam Vitals and nursing note reviewed.  Constitutional:      Appearance: Normal appearance.  Cardiovascular:     Rate and Rhythm: Normal rate.  Pulmonary:     Effort: Pulmonary effort is normal.   Abdominal:     General: There is no distension.  Musculoskeletal:        General: Normal range of motion.  Skin:    General: Skin is warm and dry.  Neurological:     Mental Status: He is alert and oriented to person, place, and time.     Gait: Gait is intact.  Psychiatric:        Mood and Affect: Affect normal. Mood is anxious.     Recent Labs     Component Value Date/Time   NA 139 03/10/2023 1659  NA 138 08/31/2014 2112   K 4.1 03/10/2023 1659   K 3.8 08/31/2014 2112   CL 100 03/10/2023 1659   CL 105 08/31/2014 2112   CO2 25 03/10/2023 1659   CO2 28 08/31/2014 2112   GLUCOSE 83 03/10/2023 1659   GLUCOSE 84 01/31/2023 1644   GLUCOSE 99 08/31/2014 2112   BUN 15 03/10/2023 1659   BUN 12 08/31/2014 2112   CREATININE 1.16 03/10/2023 1659   CREATININE 1.17 08/31/2014 2112   CALCIUM 9.7 03/10/2023 1659   CALCIUM 8.9 08/31/2014 2112   PROT 7.8 01/31/2023 1644   ALBUMIN 4.6 01/31/2023 1644   AST 20 01/31/2023 1644   ALT 22 01/31/2023 1644   ALKPHOS 66 01/31/2023 1644   BILITOT 0.4 01/31/2023 1644   GFRNONAA >60 01/31/2023 1644   GFRNONAA >60 08/31/2014 2112   GFRAA >60 06/28/2019 1116   GFRAA >60 08/31/2014 2112    Lab Results  Component Value Date   WBC 6.8 01/31/2023   HGB 16.1 01/31/2023   HCT 45.5 01/31/2023   MCV 91.0 01/31/2023   PLT 236 01/31/2023   No results found for: "HGBA1C" No results found for: "CHOL", "HDL", "LDLCALC", "LDLDIRECT", "TRIG", "CHOLHDL" Lab Results  Component Value Date   TSH 1.028 09/30/2022     Assessment and Plan:  1. Primary hypertension Improved, but not quite at goal.  Will make one more dose adjustment to increase losartan to the 50 mg dose.  Continue HCTZ 25 mg/day.  This should get [place] to goal - losartan (COZAAR) 50 MG tablet; Take 1 tablet (50 mg total) by mouth daily.  Dispense: 90 tablet; Refill: 1 - hydrochlorothiazide (HYDRODIURIL) 25 MG tablet; Take 1 tablet (25 mg total) by mouth daily.  Dispense: 90 tablet;  Refill: 1  2. Generalized anxiety disorder with panic attacks Improved with sertraline dose increased last visit, supported by GAD score.  Continue current regimen.  May use hydroxyzine as needed. - sertraline (ZOLOFT) 100 MG tablet; Take 1 tablet (100 mg total) by mouth daily.  Dispense: 90 tablet; Refill: 1  3. Severe episode of recurrent major depressive disorder, without psychotic features (HCC) Improved with sertraline dose increased last visit, supported by PHQ score.  Continue current regimen.   - sertraline (ZOLOFT) 100 MG tablet; Take 1 tablet (100 mg total) by mouth daily.  Dispense: 90 tablet; Refill: 1  4. Chronic midline low back pain with sciatica, sciatica laterality unspecified Proceed with lumbar x-ray as below - DG Lumbar Spine Complete  5. Anxiety about health Continue sertraline - sertraline (ZOLOFT) 100 MG tablet; Take 1 tablet (100 mg total) by mouth daily.  Dispense: 90 tablet; Refill: 1  6. Need for influenza vaccination Flu shot given today - Flu vaccine trivalent PF, 6mos and older(Flulaval,Afluria,Fluarix,Fluzone)   Return in about 4 weeks (around 05/06/2023) for OV f/u HTN/anx.   Patient encouraged to bring all medications next visit to clear up any confusion and ensure appropriate compliance.  Alvester Morin, PA-C, DMSc, Nutritionist [facility] [phone]

## 2023-04-11 ENCOUNTER — Telehealth: Payer: Self-pay

## 2023-04-11 NOTE — Telephone Encounter (Signed)
-----   Message from Allen Norris sent at 04/08/2023 12:07 PM EDT ----- Regarding: [facility] Please try to obtain records for recent office visit with [facility].  We should not need records release as we are the referring provider.

## 2023-04-11 NOTE — Telephone Encounter (Signed)
Called [facility]. Records will be faxed over.  KP

## 2023-04-20 ENCOUNTER — Ambulatory Visit: Payer: Medicaid Other | Admitting: Gastroenterology

## 2023-04-26 DIAGNOSIS — Z419 Encounter for procedure for purposes other than remedying health state, unspecified: Secondary | ICD-10-CM | POA: Diagnosis not present

## 2023-04-27 ENCOUNTER — Ambulatory Visit: Payer: Medicaid Other | Admitting: Gastroenterology

## 2023-04-27 NOTE — Progress Notes (Deleted)
Gastroenterology Consultation  Referring Provider:     Remo Lipps, PA Primary Care Physician:  Remo Lipps, PA Primary Gastroenterologist:  Dr. Servando Snare     Reason for Consultation:     Dysphagia        HPI:   Jacob Mckinney is a 42 y.o. y/o male referred for consultation & management of dysphagia by Dr. [name], [name], PA.  This patient comes in today after being seen by ENT for vertigo allergic rhinitis.  The patient also has a history of GERD and dysphagia.  The patient has been started on omeprazole twice a day for his symptoms.   Past Medical History:  Diagnosis Date   Anxiety    Substance abuse (HCC)     No past surgical history on file.  Prior to Admission medications   Medication Sig Start Date End Date Taking? Authorizing Provider  fluticasone (FLONASE) 50 MCG/ACT nasal spray Place 2 sprays into both nostrils daily. Prime the bottle before first use. First week use 2 sprays each nostril once daily. Tilt head forward slightly, angle nozzle slightly away from midline, gently inhale as you spray. 02/24/23   Remo Lipps, PA  hydrochlorothiazide (HYDRODIURIL) 25 MG tablet Take 1 tablet (25 mg total) by mouth daily. 04/08/23   Remo Lipps, PA  hydrOXYzine (ATARAX) 25 MG tablet Take 1 tablet (25 mg total) by mouth 3 (three) times daily as needed. 03/10/23   Remo Lipps, PA  losartan (COZAAR) 50 MG tablet Take 1 tablet (50 mg total) by mouth daily. 04/08/23   Remo Lipps, PA  omeprazole (PRILOSEC) 20 MG capsule Take 1 capsule (20 mg total) by mouth 2 (two) times daily before a meal. 02/24/23   Remo Lipps, PA  sertraline (ZOLOFT) 100 MG tablet Take 1 tablet (100 mg total) by mouth daily. 04/08/23   Remo Lipps, PA    Family History  Problem Relation Age of Onset   Heart disease Maternal Grandfather    Heart attack Maternal Grandfather      Social History   Tobacco Use   Smoking status: Former    Current packs/day: 0.00     Average packs/day: 1.5 packs/day for 24.7 years (37.1 ttl pk-yrs)    Types: Cigarettes    Start date: 02/23/1998    Quit date: 11/24/2022    Years since quitting: 0.4   Smokeless tobacco: Never  Vaping Use   Vaping status: Every Day   Substances: Nicotine  Substance Use Topics   Alcohol use: No   Drug use: Not Currently    Types: Cocaine, Methamphetamines    Allergies as of 04/27/2023 - Review Complete 04/08/2023  Allergen Reaction Noted   Penicillins Rash and Other (See Comments) 03/11/2015    Review of Systems:    All systems reviewed and negative except where noted in HPI.   Physical Exam:  There were no vitals taken for this visit. No LMP for male patient. General:   Alert,  Well-developed, well-nourished, pleasant and cooperative in NAD Head:  Normocephalic and atraumatic. Eyes:  Sclera clear, no icterus.   Conjunctiva pink. Ears:  Normal auditory acuity. Neck:  Supple; no masses or thyromegaly. Lungs:  Respirations even and unlabored.  Clear throughout to auscultation.   No wheezes, crackles, or rhonchi. No acute distress. Heart:  Regular rate and rhythm; no murmurs, clicks, rubs, or gallops. Abdomen:  Normal bowel sounds.  No bruits.  Soft, non-tender and non-distended without masses, hepatosplenomegaly or  hernias noted.  No guarding or rebound tenderness.  Negative Carnett sign.   Rectal:  Deferred.  Pulses:  Normal pulses noted. Extremities:  No clubbing or edema.  No cyanosis. Neurologic:  Alert and oriented x3;  grossly normal neurologically. Skin:  Intact without significant lesions or rashes.  No jaundice. Lymph Nodes:  No significant cervical adenopathy. Psych:  Alert and cooperative. Normal mood and affect.  Imaging Studies: No results found.  Assessment and Plan:   Jacob Mckinney is a 42 y.o. y/o male ***    Midge Minium, MD. Clementeen Graham    Note: This dictation was prepared with Dragon dictation along with smaller phrase technology. Any transcriptional  errors that result from this process are unintentional.

## 2023-05-06 ENCOUNTER — Ambulatory Visit: Payer: Medicaid Other | Admitting: Physician Assistant

## 2023-05-10 ENCOUNTER — Ambulatory Visit (INDEPENDENT_AMBULATORY_CARE_PROVIDER_SITE_OTHER): Payer: Medicaid Other | Admitting: Gastroenterology

## 2023-05-10 ENCOUNTER — Encounter: Payer: Self-pay | Admitting: Gastroenterology

## 2023-05-10 VITALS — BP 129/87 | HR 66 | Temp 98.2°F | Ht 74.0 in | Wt 210.0 lb

## 2023-05-10 DIAGNOSIS — R1310 Dysphagia, unspecified: Secondary | ICD-10-CM

## 2023-05-10 NOTE — Progress Notes (Signed)
Gastroenterology Consultation  Referring Provider:     Remo Lipps, PA Primary Care Physician:  Remo Lipps, PA Primary Gastroenterologist:  Dr. Servando Snare     Reason for Consultation:     GERD and dysphagia        HPI:   Jacob Mckinney is a 42 y.o. y/o male referred for consultation & management of GERD and dysphagia by Dr. [name], [name], PA.  This patient comes in today after being seen in the emergency department back in July and then followed up with his primary care provider on 1 August.  The patient had gone to the ER with tinnitus and GERD with dysphagia.  The patient had reported that omeprazole did not help his dysphagia.  He had stated that he was choking on food and would have to drink water to push the food down.  Because of these findings it was recommended that the patient see GI. The patient reports that his abdominal pain is worse when he moves around and not related to eating or drinking.  The patient also reports that he is gained weight recently.  There is no report of any black stools or bloody stools.  The patient also suffers from back pain.  He has had symptoms of regurgitation.  He states that his dysphagia is more to solids that it is liquids.  Past Medical History:  Diagnosis Date   Anxiety    Substance abuse (HCC)     No past surgical history on file.  Prior to Admission medications   Medication Sig Start Date End Date Taking? Authorizing Provider  fluticasone (FLONASE) 50 MCG/ACT nasal spray Place 2 sprays into both nostrils daily. Prime the bottle before first use. First week use 2 sprays each nostril once daily. Tilt head forward slightly, angle nozzle slightly away from midline, gently inhale as you spray. 02/24/23   Remo Lipps, PA  hydrochlorothiazide (HYDRODIURIL) 25 MG tablet Take 1 tablet (25 mg total) by mouth daily. 04/08/23   Remo Lipps, PA  hydrOXYzine (ATARAX) 25 MG tablet Take 1 tablet (25 mg total) by mouth 3 (three)  times daily as needed. 03/10/23   Remo Lipps, PA  losartan (COZAAR) 50 MG tablet Take 1 tablet (50 mg total) by mouth daily. 04/08/23   Remo Lipps, PA  omeprazole (PRILOSEC) 20 MG capsule Take 1 capsule (20 mg total) by mouth 2 (two) times daily before a meal. 02/24/23   Remo Lipps, PA  sertraline (ZOLOFT) 100 MG tablet Take 1 tablet (100 mg total) by mouth daily. 04/08/23   Remo Lipps, PA    Family History  Problem Relation Age of Onset   Heart disease Maternal Grandfather    Heart attack Maternal Grandfather      Social History   Tobacco Use   Smoking status: Former    Current packs/day: 0.00    Average packs/day: 1.5 packs/day for 24.7 years (37.1 ttl pk-yrs)    Types: Cigarettes    Start date: 02/23/1998    Quit date: 11/24/2022    Years since quitting: 0.4   Smokeless tobacco: Never  Vaping Use   Vaping status: Every Day   Substances: Nicotine  Substance Use Topics   Alcohol use: No   Drug use: Not Currently    Types: Cocaine, Methamphetamines    Allergies as of 05/10/2023 - Review Complete 04/08/2023  Allergen Reaction Noted   Penicillins Rash and Other (See Comments) 03/11/2015  Review of Systems:    All systems reviewed and negative except where noted in HPI.   Physical Exam:  There were no vitals taken for this visit. No LMP for male patient. General:   Alert,  Well-developed, well-nourished, pleasant and cooperative in NAD Head:  Normocephalic and atraumatic. Eyes:  Sclera clear, no icterus.   Conjunctiva pink. Ears:  Normal auditory acuity. Neck:  Supple; no masses or thyromegaly. Lungs:  Respirations even and unlabored.  Clear throughout to auscultation.   No wheezes, crackles, or rhonchi. No acute distress. Heart:  Regular rate and rhythm; no murmurs, clicks, rubs, or gallops. Abdomen:  Normal bowel sounds.  No bruits.  Soft, tenderness to 1 finger palpation while flexing the abdominal wall muscles by raising the patient's legs above  the exam table and non-distended without masses, hepatosplenomegaly or hernias noted.  No guarding or rebound tenderness.  Negative Carnett sign.   Rectal:  Deferred.  Pulses:  Normal pulses noted. Extremities:  No clubbing or edema.  No cyanosis. Neurologic:  Alert and oriented x3;  grossly normal neurologically. Skin:  Intact without significant lesions or rashes.  No jaundice. Lymph Nodes:  No significant cervical adenopathy. Psych:  Alert and cooperative. Normal mood and affect.  Imaging Studies: No results found.  Assessment and Plan:   [name] is a 42 y.o. y/o male who comes in today with dysphagia and will be set up for an EGD due to the dysphagia.  The patient's physical exam was consistent with musculoskeletal pain with 1 finger palpation increasing the patient's discomfort.  The patient has been explained the pathophysiology of musculoskeletal pain and that it is more common with people who have back pain to have the front pain he is experiencing.  The patient has been explained the plan and agrees with it.    Midge Minium, MD. Clementeen Graham    Note: This dictation was prepared with Dragon dictation along with smaller phrase technology. Any transcriptional errors that result from this process are unintentional.

## 2023-05-11 ENCOUNTER — Encounter: Payer: Self-pay | Admitting: Gastroenterology

## 2023-05-18 ENCOUNTER — Other Ambulatory Visit: Payer: Self-pay | Admitting: Physician Assistant

## 2023-05-18 DIAGNOSIS — R4589 Other symptoms and signs involving emotional state: Secondary | ICD-10-CM

## 2023-05-20 ENCOUNTER — Ambulatory Visit: Payer: Medicaid Other | Admitting: Anesthesiology

## 2023-05-20 ENCOUNTER — Ambulatory Visit
Admission: RE | Admit: 2023-05-20 | Discharge: 2023-05-20 | Disposition: A | Discharge status: Home / Self Care | Payer: Medicaid Other | Attending: Gastroenterology | Admitting: Gastroenterology

## 2023-05-20 ENCOUNTER — Other Ambulatory Visit: Payer: Self-pay

## 2023-05-20 ENCOUNTER — Encounter: Payer: Self-pay | Admitting: Gastroenterology

## 2023-05-20 ENCOUNTER — Encounter
Admission: RE | Disposition: A | Discharge status: Home / Self Care | Payer: Self-pay | Source: Home / Self Care | Attending: Gastroenterology

## 2023-05-20 DIAGNOSIS — R1310 Dysphagia, unspecified: Secondary | ICD-10-CM | POA: Diagnosis not present

## 2023-05-20 DIAGNOSIS — K2950 Unspecified chronic gastritis without bleeding: Secondary | ICD-10-CM | POA: Diagnosis not present

## 2023-05-20 DIAGNOSIS — K2970 Gastritis, unspecified, without bleeding: Secondary | ICD-10-CM

## 2023-05-20 DIAGNOSIS — K219 Gastro-esophageal reflux disease without esophagitis: Secondary | ICD-10-CM | POA: Diagnosis not present

## 2023-05-20 DIAGNOSIS — K2920 Alcoholic gastritis without bleeding: Secondary | ICD-10-CM | POA: Insufficient documentation

## 2023-05-20 DIAGNOSIS — Z87891 Personal history of nicotine dependence: Secondary | ICD-10-CM | POA: Insufficient documentation

## 2023-05-20 DIAGNOSIS — K229 Disease of esophagus, unspecified: Secondary | ICD-10-CM | POA: Diagnosis not present

## 2023-05-20 DIAGNOSIS — I10 Essential (primary) hypertension: Secondary | ICD-10-CM | POA: Diagnosis not present

## 2023-05-20 HISTORY — DX: Other stimulant abuse, in remission: F15.11

## 2023-05-20 HISTORY — DX: Generalized anxiety disorder: F41.1

## 2023-05-20 HISTORY — DX: Other seasonal allergic rhinitis: J30.2

## 2023-05-20 HISTORY — DX: Major depressive disorder, single episode, unspecified: F32.9

## 2023-05-20 HISTORY — DX: Alcoholic gastritis without bleeding: K29.20

## 2023-05-20 HISTORY — PX: ESOPHAGOGASTRODUODENOSCOPY (EGD) WITH PROPOFOL: SHX5813

## 2023-05-20 HISTORY — DX: Other chest pain: R07.89

## 2023-05-20 HISTORY — DX: Cocaine abuse, in remission: F14.11

## 2023-05-20 HISTORY — DX: Panic disorder (episodic paroxysmal anxiety): F41.1

## 2023-05-20 HISTORY — DX: Gastro-esophageal reflux disease without esophagitis: K21.9

## 2023-05-20 HISTORY — DX: Generalized anxiety disorder: F41.0

## 2023-05-20 HISTORY — DX: Dizziness and giddiness: R42

## 2023-05-20 SURGERY — ESOPHAGOGASTRODUODENOSCOPY (EGD) WITH PROPOFOL
Anesthesia: General | Site: Mouth

## 2023-05-20 MED ORDER — PROPOFOL 10 MG/ML IV BOLUS
INTRAVENOUS | Status: AC
  Filled 2023-05-20: qty 20

## 2023-05-20 MED ORDER — LIDOCAINE HCL (PF) 2 % IJ SOLN
INTRAMUSCULAR | Status: AC
  Filled 2023-05-20: qty 5

## 2023-05-20 MED ORDER — STERILE WATER FOR IRRIGATION IR SOLN
Status: DC | PRN
  Administered 2023-05-20: 1

## 2023-05-20 MED ORDER — SODIUM CHLORIDE 0.9 % IV SOLN: INTRAVENOUS | Status: DC

## 2023-05-20 MED ORDER — PROPOFOL 10 MG/ML IV BOLUS
INTRAVENOUS | Status: DC | PRN
  Administered 2023-05-20: 100 mg via INTRAVENOUS
  Administered 2023-05-20: 50 mg via INTRAVENOUS
  Administered 2023-05-20: 40 mg via INTRAVENOUS
  Administered 2023-05-20: 50 mg via INTRAVENOUS

## 2023-05-20 SURGICAL SUPPLY — 32 items
BALLN DILATOR 12-15 8 (BALLOONS) ×1
BALLN DILATOR 15-18 8 (BALLOONS)
BALLN DILATOR CRE 0-12 8 (BALLOONS)
BALLN DILATOR ESOPH 8 10 CRE (MISCELLANEOUS) IMPLANT
BALLOON DILATOR 12-15 8 (BALLOONS) IMPLANT
BALLOON DILATOR 15-18 8 (BALLOONS) IMPLANT
BALLOON DILATOR CRE 0-12 8 (BALLOONS) IMPLANT
BLOCK BITE 60FR ADLT L/F GRN (MISCELLANEOUS) ×1 IMPLANT
CLIP HMST 235XBRD CATH ROT (MISCELLANEOUS) IMPLANT
CLIP RESOLUTION 360 11X235 (MISCELLANEOUS)
ELECT REM PT RETURN 9FT ADLT (ELECTROSURGICAL)
ELECTRODE REM PT RTRN 9FT ADLT (ELECTROSURGICAL) IMPLANT
FCP ESCP3.2XJMB 240X2.8X (MISCELLANEOUS) ×1
FORCEPS BIOP RAD 4 LRG CAP 4 (CUTTING FORCEPS) IMPLANT
FORCEPS BIOP RJ4 240 W/NDL (MISCELLANEOUS) ×1
FORCEPS ESCP3.2XJMB 240X2.8X (MISCELLANEOUS) IMPLANT
GOWN CVR UNV OPN BCK APRN NK (MISCELLANEOUS) ×2 IMPLANT
GOWN ISOL THUMB LOOP REG UNIV (MISCELLANEOUS) ×2
INJECTOR VARIJECT VIN23 (MISCELLANEOUS) IMPLANT
KIT DEFENDO VALVE AND CONN (KITS) IMPLANT
KIT PRC NS LF DISP ENDO (KITS) ×1 IMPLANT
KIT PROCEDURE OLYMPUS (KITS) ×1
MANIFOLD NEPTUNE II (INSTRUMENTS) ×1 IMPLANT
MARKER SPOT ENDO TATTOO 5ML (MISCELLANEOUS) IMPLANT
RETRIEVER NET PLAT FOOD (MISCELLANEOUS) IMPLANT
SNARE SHORT THROW 13M SML OVAL (MISCELLANEOUS) IMPLANT
SNARE SHORT THROW 30M LRG OVAL (MISCELLANEOUS) IMPLANT
SYR INFLATION 60ML (SYRINGE) IMPLANT
TRAP ETRAP POLY (MISCELLANEOUS) IMPLANT
VARIJECT INJECTOR VIN23 (MISCELLANEOUS)
WATER STERILE IRR 250ML POUR (IV SOLUTION) ×1 IMPLANT
WIRE CRE 18-20MM 8CM F G (MISCELLANEOUS) IMPLANT

## 2023-05-20 NOTE — Transfer of Care (Signed)
Immediate Anesthesia Transfer of Care Note  Patient: Jacob Mckinney  Procedure(s) Performed: ESOPHAGOGASTRODUODENOSCOPY (EGD) WITH PROPOFOL (Mouth)  Patient Location: PACU  Anesthesia Type: General  Level of Consciousness: awake, alert  and patient cooperative  Airway and Oxygen Therapy: Patient Spontanous Breathing and Patient connected to supplemental oxygen  Post-op Assessment: Post-op Vital signs reviewed, Patient's Cardiovascular Status Stable, Respiratory Function Stable, Patent Airway and No signs of Nausea or vomiting  Post-op Vital Signs: Reviewed and stable  Complications: No notable events documented.

## 2023-05-20 NOTE — Anesthesia Postprocedure Evaluation (Signed)
Anesthesia Post Note  Patient: Ronik Macgillivray Vondrasek  Procedure(s) Performed: ESOPHAGOGASTRODUODENOSCOPY (EGD) WITH PROPOFOL (Mouth)  Patient location during evaluation: Endoscopy Anesthesia Type: General Level of consciousness: awake and alert Pain management: pain level controlled Vital Signs Assessment: post-procedure vital signs reviewed and stable Respiratory status: spontaneous breathing, nonlabored ventilation, respiratory function stable and patient connected to nasal cannula oxygen Cardiovascular status: blood pressure returned to baseline and stable Postop Assessment: no apparent nausea or vomiting Anesthetic complications: no   No notable events documented.   Last Vitals:  Vitals:   05/20/23 0815 05/20/23 0820  BP: 108/67 108/67  Pulse: (!) 54 (!) 52  Resp: 12 18  Temp:  36.7 C  SpO2: 97% 96%    Last Pain:  Vitals:   05/20/23 0820  TempSrc:   PainSc: 0-No pain                 Cleda Mccreedy Tavaria Mackins

## 2023-05-20 NOTE — Op Note (Signed)
[facility] Gastroenterology Patient Name: Jacob Mckinney Procedure Date: 05/20/2023 7:16 AM MRN: [id number] Account #: [id number] Date of Birth: [birth year] Admit Type: Outpatient Age: 42 Room: [facility] OR ROOM 01 Gender: Male Note Status: Finalized Instrument Name: [id number] Procedure:             Upper GI endoscopy Indications:           Dysphagia Providers:             Midge Minium MD, MD Referring MD:          [name]. Waddell (Referring MD) Medicines:             Propofol per Anesthesia Complications:         No immediate complications. Procedure:             Pre-Anesthesia Assessment:                        - Prior to the procedure, a History and Physical was                         performed, and patient medications and allergies were                         reviewed. The patient's tolerance of previous                         anesthesia was also reviewed. The risks and benefits                         of the procedure and the sedation options and risks                         were discussed with the patient. All questions were                         answered, and informed consent was obtained. Prior                         Anticoagulants: The patient has taken no anticoagulant                         or antiplatelet agents. ASA Grade Assessment: II - A                         patient with mild systemic disease. After reviewing                         the risks and benefits, the patient was deemed in                         satisfactory condition to undergo the procedure.                        After obtaining informed consent, the endoscope was                         passed under direct vision. Throughout the procedure,  the patient's blood pressure, pulse, and oxygen                         saturations were monitored continuously. The Endoscope                         was introduced through the mouth, and advanced to the                          second part of duodenum. The upper GI endoscopy was                         accomplished without difficulty. The patient tolerated                         the procedure well. Findings:      The examined esophagus was normal. Biopsies were obtained in the middle       third of the esophagus with cold forceps for histology. A TTS dilator       was passed through the scope. Dilation with a 15-16.5-18 mm balloon       dilator was performed to 18 mm. The dilation site was examined and       showed.      Localized mild inflammation characterized by erythema was found in the       gastric antrum. Biopsies were taken with a cold forceps for histology.      The examined duodenum was normal. Impression:            - Normal esophagus. Dilated.                        - Gastritis. Biopsied.                        - Normal examined duodenum.                        - Biopsies were obtained in the middle third of the                         esophagus. Recommendation:        - Discharge patient to home.                        - Resume previous diet.                        - Continue present medications.                        - Await pathology results. Procedure Code(s):     --- Professional ---                        [phone], Esophagogastroduodenoscopy, flexible,                         transoral; with transendoscopic balloon dilation of                         esophagus (less than 30 mm diameter)  01027, 59, Esophagogastroduodenoscopy, flexible,                         transoral; with biopsy, single or multiple Diagnosis Code(s):     --- Professional ---                        R13.10, Dysphagia, unspecified                        K29.70, Gastritis, unspecified, without bleeding CPT copyright 2022 American Medical Association. All rights reserved. The codes documented in this report are preliminary and upon coder review may  be revised to meet current compliance  requirements. Midge Minium MD, MD 05/20/2023 8:06:25 AM This report has been signed electronically. Number of Addenda: 0 Note Initiated On: 05/20/2023 7:16 AM Total Procedure Duration: 0 hours 5 minutes 55 seconds  Estimated Blood Loss:  Estimated blood loss: none.      [facility]

## 2023-05-20 NOTE — Anesthesia Preprocedure Evaluation (Signed)
Anesthesia Evaluation  Patient identified by MRN, date of birth, ID band Patient awake    Reviewed: Allergy & Precautions, NPO status , Patient's Chart, lab work & pertinent test results  History of Anesthesia Complications Negative for: history of anesthetic complications  Airway Mallampati: III  TM Distance: >3 FB Neck ROM: full    Dental  (+) Chipped, Poor Dentition, Missing   Pulmonary neg shortness of breath, former smoker   Pulmonary exam normal        Cardiovascular Exercise Tolerance: Good hypertension, (-) angina Normal cardiovascular exam     Neuro/Psych  PSYCHIATRIC DISORDERS      negative neurological ROS     GI/Hepatic Neg liver ROS,GERD  Controlled,,  Endo/Other  negative endocrine ROS    Renal/GU negative Renal ROS  negative genitourinary   Musculoskeletal   Abdominal   Peds  Hematology negative hematology ROS (+)   Anesthesia Other Findings Patient reports that they do not think that any food or pills are stuck in their throat at this time.  Past Medical History: No date: Acute alcoholic gastritis No date: Anxiety No date: Atypical chest pain No date: Generalized anxiety disorder with panic attacks No date: GERD (gastroesophageal reflux disease) No date: History of amphetamine abuse (HCC) No date: History of cocaine abuse (HCC) No date: Major depressive disorder No date: Seasonal allergic rhinitis No date: Substance abuse (HCC) No date: Vertigo  History reviewed. No pertinent surgical history.  BMI    Body Mass Index: 27.28 kg/m      Reproductive/Obstetrics negative OB ROS                             Anesthesia Physical Anesthesia Plan  ASA: 3  Anesthesia Plan: General   Post-op Pain Management:    Induction: Intravenous  PONV Risk Score and Plan: Propofol infusion and TIVA  Airway Management Planned: Natural Airway and Nasal Cannula  Additional  Equipment:   Intra-op Plan:   Post-operative Plan:   Informed Consent: I have reviewed the patients History and Physical, chart, labs and discussed the procedure including the risks, benefits and alternatives for the proposed anesthesia with the patient or authorized representative who has indicated his/her understanding and acceptance.     Dental Advisory Given  Plan Discussed with: Anesthesiologist, CRNA and Surgeon  Anesthesia Plan Comments: (Patient consented for risks of anesthesia including but not limited to:  - adverse reactions to medications - risk of airway placement if required - damage to eyes, teeth, lips or other oral mucosa - nerve damage due to positioning  - sore throat or hoarseness - Damage to heart, brain, nerves, lungs, other parts of body or loss of life  Patient voiced understanding and assent.)       Anesthesia Quick Evaluation

## 2023-05-20 NOTE — H&P (Signed)
Midge Minium, MD [facility] [address] [place], [place] [address] [phone] Fax : [phone]  Primary Care Physician:  Remo Lipps, PA Primary Gastroenterologist:  Dr. Servando Snare  Pre-Procedure History & Physical: HPI:  Jacob Mckinney is a 42 y.o. male is here for an endoscopy.   Past Medical History:  Diagnosis Date   Acute alcoholic gastritis    Anxiety    Atypical chest pain    Generalized anxiety disorder with panic attacks    GERD (gastroesophageal reflux disease)    History of amphetamine abuse (HCC)    History of cocaine abuse (HCC)    Major depressive disorder    Seasonal allergic rhinitis    Substance abuse (HCC)    Vertigo     History reviewed. No pertinent surgical history.  Prior to Admission medications   Medication Sig Start Date End Date Taking? Authorizing Provider  losartan (COZAAR) 50 MG tablet Take 1 tablet (50 mg total) by mouth daily. 04/08/23  Yes Remo Lipps, PA  omeprazole (PRILOSEC) 20 MG capsule Take 1 capsule (20 mg total) by mouth 2 (two) times daily before a meal. 02/24/23  Yes Waddell, [name], PA  sertraline (ZOLOFT) 100 MG tablet Take 1 tablet (100 mg total) by mouth daily. 04/08/23  Yes Waddell, Daniel P, PA  fluticasone (FLONASE) 50 MCG/ACT nasal spray Place 2 sprays into both nostrils daily. Prime the bottle before first use. First week use 2 sprays each nostril once daily. Tilt head forward slightly, angle nozzle slightly away from midline, gently inhale as you spray. Patient not taking: Reported on 05/11/2023 02/24/23   Remo Lipps, PA  hydrochlorothiazide (HYDRODIURIL) 25 MG tablet Take 1 tablet (25 mg total) by mouth daily. Patient not taking: Reported on 05/11/2023 04/08/23   Remo Lipps, PA    Allergies as of 05/10/2023 - Review Complete 05/10/2023  Allergen Reaction Noted   Penicillins Rash and Other (See Comments) 03/11/2015    Family History  Problem Relation Age of Onset   Heart disease  Maternal Grandfather    Heart attack Maternal Grandfather     Social History   Socioeconomic History   Marital status: Single    Spouse name: Not on file   Number of children: 0   Years of education: Not on file   Highest education level: Not on file  Occupational History   Not on file  Tobacco Use   Smoking status: Former    Current packs/day: 0.00    Average packs/day: 1.5 packs/day for 24.7 years (37.1 ttl pk-yrs)    Types: Cigarettes    Start date: 02/23/1998    Quit date: 11/24/2022    Years since quitting: 0.4   Smokeless tobacco: Never  Vaping Use   Vaping status: Every Day   Substances: Nicotine  Substance and Sexual Activity   Alcohol use: No   Drug use: Not Currently    Types: Cocaine, Methamphetamines   Sexual activity: Yes  Other Topics Concern   Not on file  Social History Narrative   Not on file   Social Determinants of Health   Financial Resource Strain: Not on file  Food Insecurity: Not on file  Transportation Needs: Not on file  Physical Activity: Not on file  Stress: Not on file  Social Connections: Not on file  Intimate Partner Violence: Not on file    Review of Systems: See HPI, otherwise negative ROS  Physical Exam: BP 131/80   Pulse (!) 55  Temp 98.1 F (36.7 C) (Temporal)   Resp 16   Ht 6\' 2"  (1.88 m)   Wt 96.4 kg   SpO2 99%   BMI 27.28 kg/m  General:   Alert,  pleasant and cooperative in NAD Head:  Normocephalic and atraumatic. Neck:  Supple; no masses or thyromegaly. Lungs:  Clear throughout to auscultation.    Heart:  Regular rate and rhythm. Abdomen:  Soft, nontender and nondistended. Normal bowel sounds, without guarding, and without rebound.   Neurologic:  Alert and  oriented x4;  grossly normal neurologically.  Impression/Plan: Jacob Mckinney is here for an endoscopy to be performed for Dyspahgia  Risks, benefits, limitations, and alternatives regarding  endoscopy have been reviewed with the patient.  Questions  have been answered.  All parties agreeable.   Midge Minium, MD  05/20/2023, 7:49 AM

## 2023-05-21 ENCOUNTER — Encounter: Payer: Self-pay | Admitting: Gastroenterology

## 2023-05-23 LAB — SURGICAL PATHOLOGY

## 2023-05-25 ENCOUNTER — Other Ambulatory Visit: Payer: Self-pay

## 2023-05-25 MED ORDER — PANTOPRAZOLE SODIUM 40 MG PO TBEC: 40.0000 mg | DELAYED_RELEASE_TABLET | Freq: Every day | ORAL | 2 refills | Status: DC

## 2023-05-27 ENCOUNTER — Encounter: Payer: Self-pay | Admitting: Physician Assistant

## 2023-05-27 DIAGNOSIS — Z419 Encounter for procedure for purposes other than remedying health state, unspecified: Secondary | ICD-10-CM | POA: Diagnosis not present

## 2023-06-26 DIAGNOSIS — Z419 Encounter for procedure for purposes other than remedying health state, unspecified: Secondary | ICD-10-CM | POA: Diagnosis not present

## 2023-07-21 ENCOUNTER — Encounter: Payer: Self-pay | Admitting: Gastroenterology

## 2023-07-27 DIAGNOSIS — Z419 Encounter for procedure for purposes other than remedying health state, unspecified: Secondary | ICD-10-CM | POA: Diagnosis not present

## 2023-08-27 DIAGNOSIS — Z419 Encounter for procedure for purposes other than remedying health state, unspecified: Secondary | ICD-10-CM | POA: Diagnosis not present

## 2023-09-07 ENCOUNTER — Ambulatory Visit: Payer: Medicaid Other | Attending: Physician Assistant

## 2023-09-07 ENCOUNTER — Encounter: Payer: Self-pay | Admitting: Physician Assistant

## 2023-09-07 ENCOUNTER — Ambulatory Visit (INDEPENDENT_AMBULATORY_CARE_PROVIDER_SITE_OTHER): Payer: Medicaid Other | Admitting: Physician Assistant

## 2023-09-07 VITALS — BP 130/90 | HR 64 | Temp 97.8°F | Ht 74.0 in | Wt 209.0 lb

## 2023-09-07 DIAGNOSIS — R42 Dizziness and giddiness: Secondary | ICD-10-CM | POA: Diagnosis not present

## 2023-09-07 DIAGNOSIS — M5440 Lumbago with sciatica, unspecified side: Secondary | ICD-10-CM | POA: Diagnosis not present

## 2023-09-07 DIAGNOSIS — R55 Syncope and collapse: Secondary | ICD-10-CM

## 2023-09-07 DIAGNOSIS — G8929 Other chronic pain: Secondary | ICD-10-CM | POA: Diagnosis not present

## 2023-09-07 DIAGNOSIS — I10 Essential (primary) hypertension: Secondary | ICD-10-CM

## 2023-09-07 MED ORDER — ONDANSETRON HCL 8 MG PO TABS: 8.0000 mg | ORAL_TABLET | Freq: Three times a day (TID) | ORAL | 1 refills | Status: DC | PRN

## 2023-09-07 MED ORDER — LOSARTAN POTASSIUM 25 MG PO TABS: 25.0000 mg | ORAL_TABLET | Freq: Every day | ORAL | 1 refills | Status: DC

## 2023-09-07 MED ORDER — HYDROCHLOROTHIAZIDE 25 MG PO TABS: 25.0000 mg | ORAL_TABLET | Freq: Every day | ORAL | 1 refills | Status: DC

## 2023-09-07 NOTE — Progress Notes (Signed)
Date:  09/07/2023   Name:  Jacob Mckinney   DOB:  August 06, 1980   MRN:  [id number]   Chief Complaint: GI Problem (Left sided pain, had a procedure done was told airway was closed they open the airway up, having rumbling in stomach, ), Headache (X2 weeks, Daily, constant, behind eyes, sharp pain ), and Dizziness (All the time, hasn't been to work since 2/6, feels like the room is spinning, had a fall at work )  HPI Genelle Bal presents today for follow up on chronic conditions, namely his dizziness, tinnitus, and paresthesia of upper and lower extremities. ENT evaluation was not very productive, no imaging done, patient does not desire return to ENT.   He also had recent syncope 09/01/23 reporting LOC at work but declined EMS transport to hospital. Says he was lifting something up and when he stood up, he passed out and woke up a few seconds later. A coworker was able to catch him to prevent head injury. No seizure activity. Eats a meal before work every day so he does not suspect hypoglycemia. Stays well hydrated during work. This event has made him very worried about returning to work, has a lot of anxiety about health, is desperate to "figure out what is going on."   Home bp typically 120's/60s on current regimen with which he reports good compliance.   Regarding his chronic mid-low back pain with leg paresthesias, I ordered lumbar x-ray last visit but it was never completed despite reminders.    Medication list has been reviewed and updated.  Current Meds  Medication Sig   fluticasone (FLONASE) 50 MCG/ACT nasal spray Place 2 sprays into both nostrils daily. Prime the bottle before first use. First week use 2 sprays each nostril once daily. Tilt head forward slightly, angle nozzle slightly away from midline, gently inhale as you spray.   ondansetron (ZOFRAN) 8 MG tablet Take 1 tablet (8 mg total) by mouth every 8 (eight) hours as needed for nausea.   pantoprazole (PROTONIX) 40 MG tablet Take  1 tablet (40 mg total) by mouth daily.   sertraline (ZOLOFT) 100 MG tablet Take 1 tablet (100 mg total) by mouth daily.   [DISCONTINUED] hydrochlorothiazide (HYDRODIURIL) 25 MG tablet Take 1 tablet (25 mg total) by mouth daily.   [DISCONTINUED] losartan (COZAAR) 50 MG tablet Take 1 tablet (50 mg total) by mouth daily.     Review of Systems  Patient Active Problem List   Diagnosis Date Noted   Dysphagia 05/20/2023   Gastritis without bleeding 05/20/2023   Generalized anxiety disorder with panic attacks 03/10/2023   Severe episode of recurrent major depressive disorder, without psychotic features (HCC) 03/10/2023   Anxiety about health 02/24/2023   Vapes nicotine containing substance 02/24/2023   Primary hypertension 02/24/2023   GERD with esophagitis 02/24/2023   Seasonal allergic rhinitis 02/24/2023   History of substance use disorder 02/24/2023   Shortness of breath 02/24/2023   Foul smelling urine 02/24/2023   Vertigo 02/24/2023    Allergies  Allergen Reactions   Penicillins Rash and Other (See Comments)    Patient unsure, just knows he is allergic    Immunization History  Administered Date(s) Administered   Influenza, Seasonal, Injecte, Preservative Fre 04/08/2023   Tdap 03/10/2023    Past Surgical History:  Procedure Laterality Date   ESOPHAGOGASTRODUODENOSCOPY (EGD) WITH PROPOFOL N/A 05/20/2023   Procedure: ESOPHAGOGASTRODUODENOSCOPY (EGD) WITH PROPOFOL;  Surgeon: Midge Minium, MD;  Location: [facility];  Service: Endoscopy;  Laterality: N/A;  Social History   Tobacco Use   Smoking status: Former    Current packs/day: 0.00    Average packs/day: 1.5 packs/day for 24.7 years (37.1 ttl pk-yrs)    Types: Cigarettes    Start date: 02/23/1998    Quit date: 11/24/2022    Years since quitting: 0.7   Smokeless tobacco: Never  Vaping Use   Vaping status: Every Day   Substances: Nicotine  Substance Use Topics   Alcohol use: No   Drug use: Not Currently     Types: Cocaine, Methamphetamines    Family History  Problem Relation Age of Onset   Heart disease Maternal Grandfather    Heart attack Maternal Grandfather         09/07/2023   10:08 AM 04/08/2023   10:30 AM 03/10/2023    4:07 PM 02/24/2023    9:55 AM  GAD 7 : Generalized Anxiety Score  Nervous, Anxious, on Edge 2 2 3 3   Control/stop worrying 3 2 3 3   Worry too much - different things 3 2 3 3   Trouble relaxing 3 2 3 3   Restless 3 2 3 3   Easily annoyed or irritable 1 1 3 3   Afraid - awful might happen 3 1 3 3   Total GAD 7 Score 18 12 21 21   Anxiety Difficulty Somewhat difficult Somewhat difficult Extremely difficult Extremely difficult       09/07/2023   10:08 AM 04/08/2023   10:30 AM 03/10/2023    4:06 PM  Depression screen PHQ 2/9  Decreased Interest  1 1  Down, Depressed, Hopeless 0 1 1  PHQ - 2 Score 0 2 2  Altered sleeping  2 3  Tired, decreased energy  2 3  Change in appetite  2 3  Feeling bad or failure about yourself   1 1  Trouble concentrating  2 3  Moving slowly or fidgety/restless  2 3  Suicidal thoughts  0 0  PHQ-9 Score  13 18  Difficult doing work/chores  Not difficult at all Not difficult at all    BP Readings from Last 3 Encounters:  09/07/23 (!) 130/90  05/20/23 108/67  05/10/23 129/87    Wt Readings from Last 3 Encounters:  05/20/23 212 lb 8 oz (96.4 kg)  05/10/23 210 lb (95.3 kg)  04/08/23 208 lb (94.3 kg)    BP (!) 130/90 (Patient Position: Standing)   Pulse 64   Temp 97.8 F (36.6 C)   Ht 6\' 2"  (1.88 m)   SpO2 98%   BMI 27.28 kg/m   Physical Exam Vitals and nursing note reviewed.  Constitutional:      Appearance: Normal appearance.  HENT:     Right Ear: Tympanic membrane normal. No middle ear effusion.     Left Ear: Tympanic membrane normal.  No middle ear effusion.  Cardiovascular:     Rate and Rhythm: Normal rate and regular rhythm.     Heart sounds: No murmur heard.    No friction rub. No gallop.     Comments: Orthostatics  normal - expected small drop in BP without tachycardia.  Pulmonary:     Effort: Pulmonary effort is normal.     Breath sounds: Normal breath sounds.  Abdominal:     General: There is no distension.  Musculoskeletal:        General: Normal range of motion.  Skin:    General: Skin is warm and dry.  Neurological:     Mental Status: He is alert and oriented  to person, place, and time.     Gait: Gait is intact.     Comments: Neg Dix-Hallpike Bilaterally  Psychiatric:        Mood and Affect: Mood and affect normal.     Recent Labs     Component Value Date/Time   NA 139 03/10/2023 1659   NA 138 08/31/2014 2112   K 4.1 03/10/2023 1659   K 3.8 08/31/2014 2112   CL 100 03/10/2023 1659   CL 105 08/31/2014 2112   CO2 25 03/10/2023 1659   CO2 28 08/31/2014 2112   GLUCOSE 83 03/10/2023 1659   GLUCOSE 84 01/31/2023 1644   GLUCOSE 99 08/31/2014 2112   BUN 15 03/10/2023 1659   BUN 12 08/31/2014 2112   CREATININE 1.16 03/10/2023 1659   CREATININE 1.17 08/31/2014 2112   CALCIUM 9.7 03/10/2023 1659   CALCIUM 8.9 08/31/2014 2112   PROT 7.8 01/31/2023 1644   ALBUMIN 4.6 01/31/2023 1644   AST 20 01/31/2023 1644   ALT 22 01/31/2023 1644   ALKPHOS 66 01/31/2023 1644   BILITOT 0.4 01/31/2023 1644   GFRNONAA >60 01/31/2023 1644   GFRNONAA >60 08/31/2014 2112   GFRAA >60 06/28/2019 1116   GFRAA >60 08/31/2014 2112    Lab Results  Component Value Date   WBC 6.8 01/31/2023   HGB 16.1 01/31/2023   HCT 45.5 01/31/2023   MCV 91.0 01/31/2023   PLT 236 01/31/2023   No results found for: "HGBA1C" No results found for: "CHOL", "HDL", "LDLCALC", "LDLDIRECT", "TRIG", "CHOLHDL" Lab Results  Component Value Date   TSH 1.028 09/30/2022     Assessment and Plan:  1. Syncope, unspecified syncope type (Primary) Submitting urgent referrals to cardiology and neurology for further evaluation.  Recent labs normal.  Also ordering Zio patch.  Brain imaging would be helpful considering the syncope,  tinnitus, dizziness, paresthesia, headaches. - Ambulatory referral to Cardiology - Ambulatory referral to Neurology - LONG TERM MONITOR (3-14 DAYS)  2. Primary hypertension On the off chance that this is orthostasis given his low diastolic home BP readings, refilling medications as below with losartan at 25 mg instead of 50 mg.  Patient advised he can split the 50 mg tablets he has at home until the next dispense. - hydrochlorothiazide (HYDRODIURIL) 25 MG tablet; Take 1 tablet (25 mg total) by mouth daily.  Dispense: 90 tablet; Refill: 1 - losartan (COZAAR) 25 MG tablet; Take 1 tablet (25 mg total) by mouth daily.  Dispense: 90 tablet; Refill: 1  3. Dizziness Prescribing Zofran for symptomatic control.  Do not suspect BPPV at this time. - ondansetron (ZOFRAN) 8 MG tablet; Take 1 tablet (8 mg total) by mouth every 8 (eight) hours as needed for nausea.  Dispense: 30 tablet; Refill: 1  4. Chronic midline low back pain with sciatica, sciatica laterality unspecified Patient will complete his lumbar x-ray today.   F/u TBD   Alvester Morin, PA-C, DMSc, Nutritionist [facility] [phone]

## 2023-09-08 ENCOUNTER — Telehealth: Payer: Self-pay | Admitting: Physician Assistant

## 2023-09-08 NOTE — Telephone Encounter (Signed)
Faxed.   KP

## 2023-09-08 NOTE — Telephone Encounter (Signed)
Letter printed      KP

## 2023-09-08 NOTE — Telephone Encounter (Signed)
Copied from CRM [id number]. Topic: General - Other >> Sep 08, 2023  9:31 AM Turkey B wrote: Reason for CRM: pt called in states needs email sent to his job, showing that he had Dr's appt yesterday, 02/12. Email is [email or web address]

## 2023-09-09 DIAGNOSIS — R55 Syncope and collapse: Secondary | ICD-10-CM | POA: Diagnosis not present

## 2023-09-09 DIAGNOSIS — I2089 Other forms of angina pectoris: Secondary | ICD-10-CM | POA: Diagnosis not present

## 2023-09-09 DIAGNOSIS — I10 Essential (primary) hypertension: Secondary | ICD-10-CM | POA: Diagnosis not present

## 2023-09-09 DIAGNOSIS — R0609 Other forms of dyspnea: Secondary | ICD-10-CM | POA: Diagnosis not present

## 2023-09-12 ENCOUNTER — Ambulatory Visit
Admission: RE | Admit: 2023-09-12 | Discharge: 2023-09-12 | Disposition: A | Discharge status: Home / Self Care | Payer: Medicaid Other | Source: Ambulatory Visit | Attending: Physician Assistant | Admitting: Physician Assistant

## 2023-09-12 ENCOUNTER — Ambulatory Visit
Admission: RE | Admit: 2023-09-12 | Discharge: 2023-09-12 | Disposition: A | Discharge status: Home / Self Care | Payer: Medicaid Other | Attending: Physician Assistant | Admitting: Physician Assistant

## 2023-09-12 DIAGNOSIS — M5117 Intervertebral disc disorders with radiculopathy, lumbosacral region: Secondary | ICD-10-CM | POA: Diagnosis not present

## 2023-09-12 DIAGNOSIS — M48061 Spinal stenosis, lumbar region without neurogenic claudication: Secondary | ICD-10-CM | POA: Diagnosis not present

## 2023-09-12 DIAGNOSIS — M545 Low back pain, unspecified: Secondary | ICD-10-CM | POA: Diagnosis not present

## 2023-09-24 DIAGNOSIS — Z419 Encounter for procedure for purposes other than remedying health state, unspecified: Secondary | ICD-10-CM | POA: Diagnosis not present

## 2023-09-28 NOTE — Telephone Encounter (Signed)
 Please review.  KP

## 2023-09-28 NOTE — Telephone Encounter (Signed)
 Please schedule pt an appt.  KP

## 2023-09-30 DIAGNOSIS — I2089 Other forms of angina pectoris: Secondary | ICD-10-CM | POA: Diagnosis not present

## 2023-09-30 DIAGNOSIS — R0609 Other forms of dyspnea: Secondary | ICD-10-CM | POA: Diagnosis not present

## 2023-09-30 DIAGNOSIS — R55 Syncope and collapse: Secondary | ICD-10-CM | POA: Diagnosis not present

## 2023-10-31 ENCOUNTER — Other Ambulatory Visit: Payer: Self-pay | Admitting: Physician Assistant

## 2023-10-31 DIAGNOSIS — K219 Gastro-esophageal reflux disease without esophagitis: Secondary | ICD-10-CM

## 2023-11-05 DIAGNOSIS — Z419 Encounter for procedure for purposes other than remedying health state, unspecified: Secondary | ICD-10-CM | POA: Diagnosis not present

## 2023-11-09 ENCOUNTER — Other Ambulatory Visit: Payer: Self-pay | Admitting: Physician Assistant

## 2023-11-09 DIAGNOSIS — R42 Dizziness and giddiness: Secondary | ICD-10-CM

## 2023-11-10 NOTE — Telephone Encounter (Signed)
 Requested medication (s) are due for refill today - yes  Requested medication (s) are on the active medication list -yes  Future visit scheduled -no  Last refill: 09/07/23 #30 1RF  Notes to clinic: non delegated Rx  Requested Prescriptions  Pending Prescriptions Disp Refills   ondansetron (ZOFRAN) 8 MG tablet [Pharmacy Med Name: ONDANSETRON 8MG  TABLETS] 30 tablet 1    Sig: TAKE 1 TABLET(8 MG) BY MOUTH EVERY 8 HOURS AS NEEDED FOR NAUSEA     Not Delegated - Gastroenterology: Antiemetics - ondansetron Failed - 11/10/2023  4:13 PM      Failed - This refill cannot be delegated      Passed - AST in normal range and within 360 days    AST  Date Value Ref Range Status  01/31/2023 20 15 - 41 U/L Final         Passed - ALT in normal range and within 360 days    ALT  Date Value Ref Range Status  01/31/2023 22 0 - 44 U/L Final         Passed - Valid encounter within last 6 months    Recent Outpatient Visits           2 months ago Syncope, unspecified syncope type   [facility] at [facility], [place], [place]                 Requested Prescriptions  Pending Prescriptions Disp Refills   ondansetron (ZOFRAN) 8 MG tablet [Pharmacy Med Name: ONDANSETRON 8MG  TABLETS] 30 tablet 1    Sig: TAKE 1 TABLET(8 MG) BY MOUTH EVERY 8 HOURS AS NEEDED FOR NAUSEA     Not Delegated - Gastroenterology: Antiemetics - ondansetron Failed - 11/10/2023  4:13 PM      Failed - This refill cannot be delegated      Passed - AST in normal range and within 360 days    AST  Date Value Ref Range Status  01/31/2023 20 15 - 41 U/L Final         Passed - ALT in normal range and within 360 days    ALT  Date Value Ref Range Status  01/31/2023 22 0 - 44 U/L Final         Passed - Valid encounter within last 6 months    Recent Outpatient Visits           2 months ago Syncope, unspecified syncope type   [facility] at [facility], [place], [place]

## 2023-11-16 ENCOUNTER — Ambulatory Visit: Payer: Self-pay

## 2023-11-16 DIAGNOSIS — R309 Painful micturition, unspecified: Secondary | ICD-10-CM | POA: Diagnosis not present

## 2023-11-16 DIAGNOSIS — Z8719 Personal history of other diseases of the digestive system: Secondary | ICD-10-CM | POA: Diagnosis not present

## 2023-11-16 DIAGNOSIS — R1010 Upper abdominal pain, unspecified: Secondary | ICD-10-CM | POA: Diagnosis not present

## 2023-11-16 NOTE — Telephone Encounter (Signed)
 Noted  Pt has a appt.  KP

## 2023-11-16 NOTE — Telephone Encounter (Signed)
  Chief Complaint: Abdominal pain Symptoms: upper abdominal pain, diarrhea Frequency: abdominal pain has been going on for a period of time Pertinent Negatives: Patient denies CP, SOB Disposition: [] ED /[] Urgent Care (no appt availability in office) / [x] Appointment(In office/virtual)/ []  [facility]/ [] Home Care/ [] Refused Recommended Disposition /[] [facility]/ []  Follow-up with PCP Additional Notes: patient calling with the main objective of getting an appointment. Patient was transferred to Nurse triage due to symptoms. Patient endorses upper abdominal pain along with a dx of GERD. Patient states he has been put on medication but hasn't seen improvement in his GERD. Patient reports diarrhea as well. Per protocol, patient is recommended to be seen within 2 weeks. Patient is requesting to be seen on Friday due to transportation. Appointment made with PCP for 1:40 PM on 11/18/2023. Patient verbalized understanding of plan and all questions answered.    Copied from CRM [id number]. Topic: Clinical - Red Word Triage >> Nov 16, 2023 12:08 PM Ethelle Herb L wrote: Red Word that prompted transfer to Nurse Triage: worsening pain Reason for Disposition  Abdominal pain is a chronic symptom (recurrent or ongoing AND present > 4 weeks)  Answer Assessment - Initial Assessment Questions 1. LOCATION: "Where does it hurt?"      Upper abdominal pain 2. RADIATION: "Does the pain shoot anywhere else?" (e.g., chest, back)     Goes from right to left 3. ONSET: "When did the pain begin?" (e.g., minutes, hours or days ago)      Pain has been going on for awhile and getting worse 4. SUDDEN: "Gradual or sudden onset?"     gradual 5. PATTERN "Does the pain come and go, or is it constant?"    - If it comes and goes: "How long does it last?" "Do you have pain now?"     (Note: Comes and goes means the pain is intermittent. It goes away completely between bouts.)    - If constant: "Is it getting better,  staying the same, or getting worse?"      (Note: Constant means the pain never goes away completely; most serious pain is constant and gets worse.)      constant 6. SEVERITY: "How bad is the pain?"  (e.g., Scale 1-10; mild, moderate, or severe)    - MILD (1-3): Doesn't interfere with normal activities, abdomen soft and not tender to touch..     - MODERATE (4-7): Interferes with normal activities or awakens from sleep, abdomen tender to touch.     - SEVERE (8-10): Excruciating pain, doubled over, unable to do any normal activities.       8 out of 10 7. RECURRENT SYMPTOM: "Have you ever had this type of stomach pain before?" If Yes, ask: "When was the last time?" and "What happened that time?"      Yes has been going on for awhile 8. AGGRAVATING FACTORS: "Does anything seem to cause this pain?" (e.g., foods, stress, alcohol)     Foods with sodium,  9. CARDIAC SYMPTOMS: "Do you have any of the following symptoms: chest pain, difficulty breathing, sweating, nausea?"     no 10. OTHER SYMPTOMS: "Do you have any other symptoms?" (e.g., back pain, diarrhea, fever, urination pain, vomiting)       Back pain, diarrhea, pain with urination  Protocols used: Abdominal Pain - Upper-A-AH

## 2023-11-18 ENCOUNTER — Encounter: Payer: Self-pay | Admitting: Physician Assistant

## 2023-11-18 ENCOUNTER — Ambulatory Visit (INDEPENDENT_AMBULATORY_CARE_PROVIDER_SITE_OTHER): Admitting: Physician Assistant

## 2023-11-18 VITALS — BP 126/94 | HR 76 | Temp 97.5°F | Ht 74.0 in | Wt 214.0 lb

## 2023-11-18 DIAGNOSIS — I10 Essential (primary) hypertension: Secondary | ICD-10-CM

## 2023-11-18 DIAGNOSIS — Z91199 Patient's noncompliance with other medical treatment and regimen due to unspecified reason: Secondary | ICD-10-CM | POA: Diagnosis not present

## 2023-11-18 DIAGNOSIS — R1013 Epigastric pain: Secondary | ICD-10-CM | POA: Diagnosis not present

## 2023-11-18 DIAGNOSIS — R55 Syncope and collapse: Secondary | ICD-10-CM | POA: Diagnosis not present

## 2023-11-18 HISTORY — DX: Patient's noncompliance with other medical treatment and regimen due to unspecified reason: Z91.199

## 2023-11-18 NOTE — Progress Notes (Signed)
 Date:  11/18/2023   Name:  Jacob Mckinney   DOB:  06/18/1981   MRN:  [id number]   Chief Complaint: Abdominal Pain (Went to UC, needs GI referral for H. Pylori ) and Medication Problem (Not taking HCTZ)  HPI Jacob Mckinney presents for ED f/u for 1-2 mos of abdominal pain, seen 11/16/23 at [facility] and recommended to speak with PCP or GI specialist for possible H. pylori testing. KUB, EKG, urine all normal.   He is a poor historian and is generally uncertain about his medications, appointments, diagnoses, and treatment plan. He was undergoing syncope eval with cardiology but overslept for his stress test and as a result his follow up OV with them was cancelled - he has nothing scheduled at present.   He was seen by GI in Oct, EGD was performed for dysphagia and they dilated the esophagus. EGD was significant for gastritis and was biopsied - pathology showed reactive gastritis with negative H. Pylori at the time. He has not seen them since.   Still having dizziness and paresthesia. Fear of repeat syncope. Does not have appt with neurology despite referral months ago which was received by Venessa Gilbert, PA-C with [facility]. Thinks he saw a neurologist who ordered an MRI pending insurance approval but I do not see any record of either a neurology visit or an MRI order.  Continues to complain of abdominal pain keeping him out of work, but it waxes and wanes. As far as medications are concerned, he is taking both omeprazole  and pantoprazole , and also drinking generic Maalox which helps some.  Has not had any alcohol in 3 months, continues with nicotine containing vape but does not smoke cigarettes or marijuana.  Denies illicit substances.  He stopped hydroxyzine  because it made him too drowsy. He stopped hydrochlorothiazide  for unknown reasons.  He has not been monitoring blood pressure at home.   Medication list has been reviewed and updated.  Current Meds  Medication Sig   fluticasone  (FLONASE ) 50  MCG/ACT nasal spray Place 2 sprays into both nostrils daily. Prime the bottle before first use. First week use 2 sprays each nostril once daily. Tilt head forward slightly, angle nozzle slightly away from midline, gently inhale as you spray.   losartan  (COZAAR ) 25 MG tablet Take 1 tablet (25 mg total) by mouth daily.   ondansetron  (ZOFRAN ) 8 MG tablet TAKE 1 TABLET(8 MG) BY MOUTH EVERY 8 HOURS AS NEEDED FOR NAUSEA   pantoprazole  (PROTONIX ) 40 MG tablet Take 1 tablet (40 mg total) by mouth daily.   sertraline  (ZOLOFT ) 100 MG tablet Take 1 tablet (100 mg total) by mouth daily.   [DISCONTINUED] fluticasone  (FLONASE ) 50 MCG/ACT nasal spray Place 2 sprays into the nose.   [DISCONTINUED] omeprazole  (PRILOSEC) 20 MG capsule Take 20 mg by mouth.     Review of Systems  Patient Active Problem List   Diagnosis Date Noted   Syncope 11/18/2023   Noncompliance 11/18/2023   Dysphagia 05/20/2023   Gastritis without bleeding 05/20/2023   Generalized anxiety disorder with panic attacks 03/10/2023   Severe episode of recurrent major depressive disorder, without psychotic features (HCC) 03/10/2023   Anxiety about health 02/24/2023   Vapes nicotine containing substance 02/24/2023   Primary hypertension 02/24/2023   GERD with esophagitis 02/24/2023   Seasonal allergic rhinitis 02/24/2023   History of substance use disorder 02/24/2023   Shortness of breath 02/24/2023   Foul smelling urine 02/24/2023   Vertigo 02/24/2023    Allergies  Allergen Reactions  Penicillins Other (See Comments), Rash and Dermatitis    Patient unsure, just knows he is allergic    Immunization History  Administered Date(s) Administered   Influenza, Seasonal, Injecte, Preservative Fre 04/08/2023   Tdap 03/10/2023    Past Surgical History:  Procedure Laterality Date   ESOPHAGOGASTRODUODENOSCOPY (EGD) WITH PROPOFOL  N/A 05/20/2023   Procedure: ESOPHAGOGASTRODUODENOSCOPY (EGD) WITH PROPOFOL ;  Surgeon: Marnee Sink, MD;   Location: [facility];  Service: Endoscopy;  Laterality: N/A;    Social History   Tobacco Use   Smoking status: Former    Current packs/day: 0.00    Average packs/day: 1.5 packs/day for 24.7 years (37.1 ttl pk-yrs)    Types: Cigarettes    Start date: 02/23/1998    Quit date: 11/24/2022    Years since quitting: 0.9   Smokeless tobacco: Never  Vaping Use   Vaping status: Every Day   Substances: Nicotine  Substance Use Topics   Alcohol use: No   Drug use: Not Currently    Types: Cocaine, Methamphetamines    Family History  Problem Relation Age of Onset   Heart disease Maternal Grandfather    Heart attack Maternal Grandfather         11/18/2023    1:44 PM 09/07/2023   10:08 AM 04/08/2023   10:30 AM 03/10/2023    4:07 PM  GAD 7 : Generalized Anxiety Score  Nervous, Anxious, on Edge 2 2 2 3   Control/stop worrying 3 3 2 3   Worry too much - different things 3 3 2 3   Trouble relaxing 3 3 2 3   Restless 3 3 2 3   Easily annoyed or irritable 1 1 1 3   Afraid - awful might happen 3 3 1 3   Total GAD 7 Score 18 18 12 21   Anxiety Difficulty Somewhat difficult Somewhat difficult Somewhat difficult Extremely difficult       11/18/2023    1:44 PM 09/07/2023   10:08 AM 04/08/2023   10:30 AM  Depression screen PHQ 2/9  Decreased Interest 1  1  Down, Depressed, Hopeless 1 0 1  PHQ - 2 Score 2 0 2  Altered sleeping 1  2  Tired, decreased energy 2  2  Change in appetite 1  2  Feeling bad or failure about yourself  0  1  Trouble concentrating 2  2  Moving slowly or fidgety/restless 3  2  Suicidal thoughts 0  0  PHQ-9 Score 11  13  Difficult doing work/chores Not difficult at all  Not difficult at all    BP Readings from Last 3 Encounters:  11/18/23 (!) 126/94  09/07/23 (!) 130/90  05/20/23 108/67    Wt Readings from Last 3 Encounters:  11/18/23 214 lb (97.1 kg)  09/07/23 209 lb (94.8 kg)  05/20/23 212 lb 8 oz (96.4 kg)    BP (!) 126/94 (BP Location: Left Arm, Cuff  Size: Large)   Pulse 76   Temp (!) 97.5 F (36.4 C)   Ht 6\' 2"  (1.88 m)   Wt 214 lb (97.1 kg)   SpO2 97%   BMI 27.48 kg/m   Physical Exam Vitals and nursing note reviewed.  Constitutional:      Appearance: Normal appearance.  Cardiovascular:     Rate and Rhythm: Normal rate and regular rhythm.     Heart sounds: No murmur heard.    No friction rub. No gallop.  Pulmonary:     Effort: Pulmonary effort is normal.     Breath sounds: Normal  breath sounds.  Abdominal:     General: Bowel sounds are increased. There is no distension.     Palpations: Abdomen is soft.     Tenderness: There is abdominal tenderness in the epigastric area. There is no right CVA tenderness, left CVA tenderness or guarding.     Hernia: No hernia is present.  Musculoskeletal:        General: Normal range of motion.  Skin:    General: Skin is warm and dry.  Neurological:     Mental Status: He is alert and oriented to person, place, and time.     Gait: Gait is intact.  Psychiatric:        Mood and Affect: Mood and affect normal.     Recent Labs     Component Value Date/Time   NA 139 03/10/2023 1659   NA 138 08/31/2014 2112   K 4.1 03/10/2023 1659   K 3.8 08/31/2014 2112   CL 100 03/10/2023 1659   CL 105 08/31/2014 2112   CO2 25 03/10/2023 1659   CO2 28 08/31/2014 2112   GLUCOSE 83 03/10/2023 1659   GLUCOSE 84 01/31/2023 1644   GLUCOSE 99 08/31/2014 2112   BUN 15 03/10/2023 1659   BUN 12 08/31/2014 2112   CREATININE 1.16 03/10/2023 1659   CREATININE 1.17 08/31/2014 2112   CALCIUM 9.7 03/10/2023 1659   CALCIUM 8.9 08/31/2014 2112   PROT 7.8 01/31/2023 1644   ALBUMIN 4.6 01/31/2023 1644   AST 20 01/31/2023 1644   ALT 22 01/31/2023 1644   ALKPHOS 66 01/31/2023 1644   BILITOT 0.4 01/31/2023 1644   GFRNONAA >60 01/31/2023 1644   GFRNONAA >60 08/31/2014 2112   GFRAA >60 06/28/2019 1116   GFRAA >60 08/31/2014 2112    Lab Results  Component Value Date   WBC 6.8 01/31/2023   HGB 16.1  01/31/2023   HCT 45.5 01/31/2023   MCV 91.0 01/31/2023   PLT 236 01/31/2023   No results found for: "HGBA1C" No results found for: "CHOL", "HDL", "LDLCALC", "LDLDIRECT", "TRIG", "CHOLHDL" Lab Results  Component Value Date   TSH 1.028 09/30/2022     Assessment and Plan:  1. Epigastric pain (Primary) Most likely this is persistent gastritis and/or esophagitis.  We discussed that H. pylori testing cannot be done without stopping PPI for 2 weeks, and I am not certain that he can do this at this time.  We will check on his baseline labs today as well as a food allergy  profile and alpha gal panel, which we discussed are lower yield but would be good to rule out.  Stop omeprazole , continue pantoprazole  daily, continue Maalox as needed  Emphasized the importance of following up with GI, with whom he is already established.  - CBC with Differential/Platelet - Comprehensive metabolic panel with GFR - Food Allergy  Profile - Alpha-Gal Panel  2. Noncompliance Missing appointments, taking some medications inappropriately, stopping others entirely.  Discussed with patient that if he wants to get answers to his symptoms as soon as possible, he will need to be more present in his own care.  He needs to make appointments with cardiology, GI, and neuro as planned.  3. Syncope, unspecified syncope type Complete workup for syncope with cardiology as set out for him  4. Primary hypertension Elevated x 2 today.  Restart hydrochlorothiazide  as prescribed, continue losartan  25 mg, continue home blood pressure monitoring. - CBC with Differential/Platelet - Comprehensive metabolic panel with GFR   Follow-up to be determined   Genella Kendall  Asa Lauth, DMSc, Nutritionist [facility] [phone]

## 2023-11-22 ENCOUNTER — Telehealth: Payer: Self-pay

## 2023-11-22 NOTE — Telephone Encounter (Signed)
 Pt lmovm requesting call back regarding his Sx of dysphagia returning.... Do I need to have pt referred out to be evaluated, or would you be okay with scheduling EGD to evaluate as pt had dilation w/ last EGD from 04/2024... Please advise

## 2023-11-23 ENCOUNTER — Ambulatory Visit: Payer: Medicaid Other | Admitting: Cardiology

## 2023-11-23 ENCOUNTER — Other Ambulatory Visit: Payer: Self-pay

## 2023-11-23 DIAGNOSIS — R1310 Dysphagia, unspecified: Secondary | ICD-10-CM

## 2023-11-23 NOTE — Telephone Encounter (Signed)
Pt has been scheduled for EGD

## 2023-11-24 LAB — FOOD ALLERGY PROFILE
Allergen Corn, IgE: <0.1 kU/L
Clam IgE: <0.1 kU/L
Codfish IgE: <0.1 kU/L
Egg White IgE: <0.1 kU/L
Milk IgE: <0.1 kU/L
Peanut IgE: <0.1 kU/L
Scallop IgE: <0.1 kU/L
Sesame Seed IgE: <0.1 kU/L
Shrimp IgE: <0.1 kU/L
Soybean IgE: <0.1 kU/L
Walnut IgE: <0.1 kU/L
Wheat IgE: <0.1 kU/L

## 2023-11-24 LAB — CBC WITH DIFFERENTIAL/PLATELET
Basophils Absolute: 0 10*3/uL (ref 0.0–0.2)
Basos: 1 %
EOS (ABSOLUTE): 0.1 10*3/uL (ref 0.0–0.4)
Eos: 2 %
Hematocrit: 45.5 % (ref 37.5–51.0)
Hemoglobin: 15.3 g/dL (ref 13.0–17.7)
Immature Grans (Abs): 0 10*3/uL (ref 0.0–0.1)
Immature Granulocytes: 0 %
Lymphocytes Absolute: 1.4 10*3/uL (ref 0.7–3.1)
Lymphs: 29 %
MCH: 30.9 pg (ref 26.6–33.0)
MCHC: 33.6 g/dL (ref 31.5–35.7)
MCV: 92 fL (ref 79–97)
Monocytes Absolute: 0.4 10*3/uL (ref 0.1–0.9)
Monocytes: 9 %
Neutrophils Absolute: 2.8 10*3/uL (ref 1.4–7.0)
Neutrophils: 59 %
Platelets: 265 10*3/uL (ref 150–450)
RBC: 4.95 x10E6/uL (ref 4.14–5.80)
RDW: 11.6 % (ref 11.6–15.4)
WBC: 4.8 10*3/uL (ref 3.4–10.8)

## 2023-11-24 LAB — COMPREHENSIVE METABOLIC PANEL WITH GFR
ALT: 14 IU/L (ref 0–44)
AST: 18 IU/L (ref 0–40)
Albumin: 4.6 g/dL (ref 4.1–5.1)
Alkaline Phosphatase: 77 IU/L (ref 44–121)
BUN/Creatinine Ratio: 9 (ref 9–20)
BUN: 10 mg/dL (ref 6–24)
Bilirubin Total: 0.4 mg/dL (ref 0.0–1.2)
CO2: 23 mmol/L (ref 20–29)
Calcium: 9.6 mg/dL (ref 8.7–10.2)
Chloride: 98 mmol/L (ref 96–106)
Creatinine, Ser: 1.1 mg/dL (ref 0.76–1.27)
Globulin, Total: 2.6 g/dL (ref 1.5–4.5)
Glucose: 79 mg/dL (ref 70–99)
Potassium: 4.9 mmol/L (ref 3.5–5.2)
Sodium: 139 mmol/L (ref 134–144)
Total Protein: 7.2 g/dL (ref 6.0–8.5)
eGFR: 85 mL/min/{1.73_m2} (ref >59)

## 2023-11-24 LAB — ALPHA-GAL PANEL
Allergen Lamb IgE: <0.1 kU/L
Beef IgE: <0.1 kU/L
IgE (Immunoglobulin E), Serum: 238 [IU]/mL (ref 6–495)
O215-IgE Alpha-Gal: <0.1 kU/L
Pork IgE: <0.1 kU/L

## 2023-12-05 ENCOUNTER — Ambulatory Visit: Admitting: Certified Registered Nurse Anesthetist

## 2023-12-05 ENCOUNTER — Encounter: Payer: Self-pay | Admitting: Gastroenterology

## 2023-12-05 ENCOUNTER — Ambulatory Visit
Admission: RE | Admit: 2023-12-05 | Discharge: 2023-12-05 | Disposition: A | Discharge status: Home / Self Care | Attending: Gastroenterology | Admitting: Gastroenterology

## 2023-12-05 ENCOUNTER — Encounter
Admission: RE | Disposition: A | Discharge status: Home / Self Care | Payer: Self-pay | Source: Home / Self Care | Attending: Gastroenterology

## 2023-12-05 DIAGNOSIS — K219 Gastro-esophageal reflux disease without esophagitis: Secondary | ICD-10-CM | POA: Insufficient documentation

## 2023-12-05 DIAGNOSIS — Z419 Encounter for procedure for purposes other than remedying health state, unspecified: Secondary | ICD-10-CM | POA: Diagnosis not present

## 2023-12-05 DIAGNOSIS — I10 Essential (primary) hypertension: Secondary | ICD-10-CM | POA: Insufficient documentation

## 2023-12-05 DIAGNOSIS — Z87891 Personal history of nicotine dependence: Secondary | ICD-10-CM | POA: Diagnosis not present

## 2023-12-05 DIAGNOSIS — R1310 Dysphagia, unspecified: Secondary | ICD-10-CM | POA: Diagnosis not present

## 2023-12-05 DIAGNOSIS — F419 Anxiety disorder, unspecified: Secondary | ICD-10-CM | POA: Insufficient documentation

## 2023-12-05 DIAGNOSIS — Z8249 Family history of ischemic heart disease and other diseases of the circulatory system: Secondary | ICD-10-CM | POA: Diagnosis not present

## 2023-12-05 DIAGNOSIS — Z79899 Other long term (current) drug therapy: Secondary | ICD-10-CM | POA: Insufficient documentation

## 2023-12-05 DIAGNOSIS — K449 Diaphragmatic hernia without obstruction or gangrene: Secondary | ICD-10-CM | POA: Diagnosis not present

## 2023-12-05 DIAGNOSIS — R1013 Epigastric pain: Secondary | ICD-10-CM | POA: Diagnosis present

## 2023-12-05 DIAGNOSIS — R011 Cardiac murmur, unspecified: Secondary | ICD-10-CM | POA: Insufficient documentation

## 2023-12-05 DIAGNOSIS — F32A Depression, unspecified: Secondary | ICD-10-CM | POA: Diagnosis not present

## 2023-12-05 HISTORY — PX: ESOPHAGOGASTRODUODENOSCOPY: SHX5428

## 2023-12-05 SURGERY — EGD (ESOPHAGOGASTRODUODENOSCOPY)
Anesthesia: General

## 2023-12-05 MED ORDER — PROPOFOL 10 MG/ML IV BOLUS
INTRAVENOUS | Status: DC | PRN
  Administered 2023-12-05: 70 mg via INTRAVENOUS
  Administered 2023-12-05: 10 mg via INTRAVENOUS
  Administered 2023-12-05: 20 mg via INTRAVENOUS

## 2023-12-05 MED ORDER — GLYCOPYRROLATE 0.2 MG/ML IJ SOLN
INTRAMUSCULAR | Status: AC
  Filled 2023-12-05: qty 1

## 2023-12-05 MED ORDER — LIDOCAINE HCL (PF) 2 % IJ SOLN
INTRAMUSCULAR | Status: AC
  Filled 2023-12-05: qty 10

## 2023-12-05 MED ORDER — PROPOFOL 500 MG/50ML IV EMUL
INTRAVENOUS | Status: DC | PRN
  Administered 2023-12-05: 160 ug/kg/min via INTRAVENOUS

## 2023-12-05 MED ORDER — DEXMEDETOMIDINE HCL IN NACL 80 MCG/20ML IV SOLN
INTRAVENOUS | Status: DC | PRN
  Administered 2023-12-05 (×2): 8 ug via INTRAVENOUS
  Administered 2023-12-05: 4 ug via INTRAVENOUS

## 2023-12-05 MED ORDER — SODIUM CHLORIDE 0.9 % IV SOLN: INTRAVENOUS | Status: DC

## 2023-12-05 MED ORDER — PROPOFOL 1000 MG/100ML IV EMUL
INTRAVENOUS | Status: AC
  Filled 2023-12-05: qty 100

## 2023-12-05 MED ORDER — LIDOCAINE HCL (CARDIAC) PF 100 MG/5ML IV SOSY
PREFILLED_SYRINGE | INTRAVENOUS | Status: DC | PRN
  Administered 2023-12-05: 100 mg via INTRAVENOUS

## 2023-12-05 NOTE — Anesthesia Postprocedure Evaluation (Signed)
 Anesthesia Post Note  Patient: Jacob Mckinney  Procedure(s) Performed: EGD (ESOPHAGOGASTRODUODENOSCOPY)  Patient location during evaluation: Endoscopy Anesthesia Type: General Level of consciousness: awake and alert Pain management: pain level controlled Vital Signs Assessment: post-procedure vital signs reviewed and stable Respiratory status: spontaneous breathing, nonlabored ventilation, respiratory function stable and patient connected to nasal cannula oxygen Cardiovascular status: blood pressure returned to baseline and stable Postop Assessment: no apparent nausea or vomiting Anesthetic complications: no   No notable events documented.   Last Vitals:  Vitals:   12/05/23 0751 12/05/23 0847  BP: (!) 136/104 104/70  Pulse: 65   Resp: 18 16  Temp: (!) 35.6 C (!) 35.8 C  SpO2: 97%     Last Pain:  Vitals:   12/05/23 0847  TempSrc: Oral  PainSc:                  Lattie Poli

## 2023-12-05 NOTE — H&P (Signed)
 Marnee Sink, MD [facility] [address] [place], [place] [address] Phone:[phone] Fax : [phone]  Primary Care Physician:  Leopoldo Rancher, PA Primary Gastroenterologist:  Dr. Ole Berkeley  Pre-Procedure History & Physical: HPI:  Jacob Mckinney is a 43 y.o. male is here for an endoscopy.   Past Medical History:  Diagnosis Date   Acute alcoholic gastritis    Anxiety    Atypical chest pain    Generalized anxiety disorder with panic attacks    GERD (gastroesophageal reflux disease)    History of amphetamine abuse (HCC)    History of cocaine abuse (HCC)    Major depressive disorder    Seasonal allergic rhinitis    Substance abuse (HCC)    Vertigo     Past Surgical History:  Procedure Laterality Date   ESOPHAGOGASTRODUODENOSCOPY (EGD) WITH PROPOFOL  N/A 05/20/2023   Procedure: ESOPHAGOGASTRODUODENOSCOPY (EGD) WITH PROPOFOL ;  Surgeon: Marnee Sink, MD;  Location: [facility];  Service: Endoscopy;  Laterality: N/A;    Prior to Admission medications   Medication Sig Start Date End Date Taking? Authorizing Provider  fluticasone  (FLONASE ) 50 MCG/ACT nasal spray Place 2 sprays into both nostrils daily. Prime the bottle before first use. First week use 2 sprays each nostril once daily. Tilt head forward slightly, angle nozzle slightly away from midline, gently inhale as you spray. 02/24/23  Yes Leopoldo Rancher, PA  losartan  (COZAAR ) 25 MG tablet Take 1 tablet (25 mg total) by mouth daily. 09/07/23  Yes Waddell, Arleen Lacer, PA  ondansetron  (ZOFRAN ) 8 MG tablet TAKE 1 TABLET(8 MG) BY MOUTH EVERY 8 HOURS AS NEEDED FOR NAUSEA 11/11/23  Yes Leopoldo Rancher, PA  pantoprazole  (PROTONIX ) 40 MG tablet Take 1 tablet (40 mg total) by mouth daily. 05/25/23  Yes Marnee Sink, MD  sertraline  (ZOLOFT ) 100 MG tablet Take 1 tablet (100 mg total) by mouth daily. 04/08/23  Yes Leopoldo Rancher, PA  hydrochlorothiazide  (HYDRODIURIL ) 25 MG tablet Take 1 tablet (25 mg total) by mouth  daily. Patient not taking: Reported on 11/18/2023 09/07/23   Leopoldo Rancher, PA    Allergies as of 11/23/2023 - Review Complete 11/18/2023  Allergen Reaction Noted   Penicillins Other (See Comments), Rash, and Dermatitis 03/11/2015    Family History  Problem Relation Age of Onset   Heart disease Maternal Grandfather    Heart attack Maternal Grandfather     Social History   Socioeconomic History   Marital status: Single    Spouse name: Not on file   Number of children: 0   Years of education: Not on file   Highest education level: Not on file  Occupational History   Not on file  Tobacco Use   Smoking status: Former    Current packs/day: 0.00    Average packs/day: 1.5 packs/day for 24.7 years (37.1 ttl pk-yrs)    Types: Cigarettes    Start date: 02/23/1998    Quit date: 11/24/2022    Years since quitting: 1.0   Smokeless tobacco: Never  Vaping Use   Vaping status: Every Day   Substances: Nicotine  Substance and Sexual Activity   Alcohol use: No   Drug use: Not Currently    Types: Cocaine, Methamphetamines   Sexual activity: Yes  Other Topics Concern   Not on file  Social History Narrative   Not on file   Social Drivers of Health   Financial Resource Strain: Not on file  Food Insecurity: No Food Insecurity (09/07/2023)   Hunger Vital  Sign    Worried About Programme researcher, broadcasting/film/video in the Last Year: Never true    Ran Out of Food in the Last Year: Never true  Transportation Needs: No Transportation Needs (09/07/2023)   PRAPARE - Administrator, Civil Service (Medical): No    Lack of Transportation (Non-Medical): No  Physical Activity: Not on file  Stress: Not on file  Social Connections: Not on file  Intimate Partner Violence: Not At Risk (09/07/2023)   Humiliation, Afraid, Rape, and Kick questionnaire    Fear of Current or Ex-Partner: No    Emotionally Abused: No    Physically Abused: No    Sexually Abused: No    Review of Systems: See HPI, otherwise  negative ROS  Physical Exam: BP (!) 136/104   Pulse 65   Temp (!) 96.1 F (35.6 C) (Temporal)   Resp 18   Ht 6\' 2"  (1.88 m)   Wt 97.1 kg   SpO2 97%   BMI 27.48 kg/m  General:   Alert,  pleasant and cooperative in NAD Head:  Normocephalic and atraumatic. Neck:  Supple; no masses or thyromegaly. Lungs:  Clear throughout to auscultation.    Heart:  Regular rate and rhythm. Abdomen:  Soft, nontender and nondistended. Normal bowel sounds, without guarding, and without rebound.   Neurologic:  Alert and  oriented x4;  grossly normal neurologically.  Impression/Plan: Jacob Mckinney is here for an endoscopy to be performed for epigastric pain  Risks, benefits, limitations, and alternatives regarding  endoscopy have been reviewed with the patient.  Questions have been answered.  All parties agreeable.   Marnee Sink, MD  12/05/2023, 8:24 AM

## 2023-12-05 NOTE — Anesthesia Preprocedure Evaluation (Signed)
 Anesthesia Evaluation  Patient identified by MRN, date of birth, ID band Patient awake    Reviewed: Allergy  & Precautions, NPO status , Patient's Chart, lab work & pertinent test results  History of Anesthesia Complications Negative for: history of anesthetic complications  Airway Mallampati: II  TM Distance: >3 FB Neck ROM: Full    Dental no notable dental hx. (+) Teeth Intact   Pulmonary shortness of breath and with exertion, neg sleep apnea, neg COPD, Patient abstained from smoking.Not current smoker, former smoker   Pulmonary exam normal breath sounds clear to auscultation       Cardiovascular Exercise Tolerance: Good METShypertension, Pt. on medications (-) CAD and (-) Past MI (-) dysrhythmias  Rhythm:Regular Rate:Normal - Systolic murmurs Patient with syncope complaints for past year - when he "makes sudden movements" he gets very dizzy and can pass out. He works at a physically demanding job. Able to do his activities, METS > 4. Does say he gets some SOB with strenuous activity, but also says it is worse with sudden movements. This has been brought up to his PCP, who notes that this patient has compliance issues with medications/testing/appointments. He has not done a stress test which was recommended by cardiologist, nor has he seen a neurologist.  EKG interpretation in care everywhere shows sinus rhythm without any ST changes   Neuro/Psych  PSYCHIATRIC DISORDERS Anxiety Depression    negative neurological ROS     GI/Hepatic ,GERD  Poorly Controlled and Medicated,,(+)     (-) substance abuse  Denies any vomiting or reflux today   Endo/Other  neg diabetes    Renal/GU negative Renal ROS     Musculoskeletal   Abdominal   Peds  Hematology   Anesthesia Other Findings Past Medical History: No date: Acute alcoholic gastritis No date: Anxiety No date: Atypical chest pain No date: Generalized anxiety disorder  with panic attacks No date: GERD (gastroesophageal reflux disease) No date: History of amphetamine abuse (HCC) No date: History of cocaine abuse (HCC) No date: Major depressive disorder No date: Seasonal allergic rhinitis No date: Substance abuse (HCC) No date: Vertigo  Reproductive/Obstetrics                             Anesthesia Physical Anesthesia Plan  ASA: 2  Anesthesia Plan: General   Post-op Pain Management: Minimal or no pain anticipated   Induction: Intravenous  PONV Risk Score and Plan: 2 and Propofol  infusion, TIVA and Ondansetron   Airway Management Planned: Nasal Cannula  Additional Equipment: None  Intra-op Plan:   Post-operative Plan:   Informed Consent: I have reviewed the patients History and Physical, chart, labs and discussed the procedure including the risks, benefits and alternatives for the proposed anesthesia with the patient or authorized representative who has indicated his/her understanding and acceptance.     Dental advisory given  Plan Discussed with: CRNA and Surgeon  Anesthesia Plan Comments: (I had a lengthy discussion about the patient's incomplete neurological and cardiac workup. Given that patient still has good exercise tolerance and is able to achieve METS>4, based on the 2024 AHA guidelines for perioperative testing, it should be reasonably safe to proceed with anesthetic today. I did give patient the option to postpone today's procedure until the above workup has been done, but he understands the potential increased risk and wishes to proceed today (his general non compliance with procedures and visits should also be taken into account). Discussed risks of anesthesia with  patient, including possibility of difficulty with spontaneous ventilation under anesthesia necessitating airway intervention, PONV, and rare risks such as cardiac or respiratory or neurological events, and allergic reactions. Discussed the role of  CRNA in patient's perioperative care. Patient understands.)       Anesthesia Quick Evaluation

## 2023-12-05 NOTE — Transfer of Care (Signed)
 Immediate Anesthesia Transfer of Care Note  Patient: Jacob Mckinney  Procedure(s) Performed: EGD (ESOPHAGOGASTRODUODENOSCOPY)  Patient Location: PACU  Anesthesia Type:General  Level of Consciousness: drowsy  Airway & Oxygen Therapy: Patient Spontanous Breathing and Patient connected to face mask oxygen  Post-op Assessment: Report given to RN and Post -op Vital signs reviewed and stable  Post vital signs: Reviewed and stable  Last Vitals:  Vitals Value Taken Time  BP    Temp    Pulse 59 12/05/23 0847  Resp    SpO2 99 % 12/05/23 0847  Vitals shown include unfiled device data.  Last Pain:  Vitals:   12/05/23 0751  TempSrc: Temporal  PainSc: 0-No pain         Complications: No notable events documented.

## 2023-12-05 NOTE — Op Note (Signed)
 [facility] Patient Name: Jacob Mckinney Procedure Date: 12/05/2023 8:28 AM MRN: [id number] Account #: [id number] Date of Birth: 04-Oct-1980 Admit Type: Outpatient Age: 43 Room: [facility] ENDO ROOM 4 Gender: Male Note Status: Finalized Instrument Name: Upper Endoscope [id number] Procedure:             Upper GI endoscopy Indications:           Epigastric abdominal pain Providers:             Marnee Sink MD, MD Referring MD:          Arleen Lacer. Waddell (Referring MD) Medicines:             Propofol  per Anesthesia Complications:         No immediate complications. Procedure:             Pre-Anesthesia Assessment:                        - Prior to the procedure, a History and Physical was                         performed, and patient medications and allergies were                         reviewed. The patient's tolerance of previous                         anesthesia was also reviewed. The risks and benefits                         of the procedure and the sedation options and risks                         were discussed with the patient. All questions were                         answered, and informed consent was obtained. Prior                         Anticoagulants: The patient has taken no anticoagulant                         or antiplatelet agents. ASA Grade Assessment: II - A                         patient with mild systemic disease. After reviewing                         the risks and benefits, the patient was deemed in                         satisfactory condition to undergo the procedure.                        After obtaining informed consent, the endoscope was                         passed under direct vision. Throughout the procedure,  the patient's blood pressure, pulse, and oxygen                         saturations were monitored continuously. The Endoscope                         was introduced through the mouth,  and advanced to the                         second part of duodenum. The upper GI endoscopy was                         accomplished without difficulty. The patient tolerated                         the procedure well. Findings:      A small hiatal hernia was present. Two biopsies were obtained in the       middle third of the esophagus with cold forceps for histology.      The entire examined stomach was normal. Biopsies were taken with a cold       forceps for histology.      The examined duodenum was normal. Impression:            - Small hiatal hernia.                        - Normal stomach. Biopsied.                        - Normal examined duodenum.                        - Two biopsies were obtained in the middle third of                         the esophagus. Recommendation:        - Discharge patient to home.                        - Resume previous diet.                        - Continue present medications.                        - Await pathology results. Procedure Code(s):     --- Professional ---                        [phone], Esophagogastroduodenoscopy, flexible,                         transoral; with biopsy, single or multiple Diagnosis Code(s):     --- Professional ---                        R10.13, Epigastric pain CPT copyright 2022 American Medical Association. All rights reserved. The codes documented in this report are preliminary and upon coder review may  be revised to meet current compliance requirements. Marnee Sink MD, MD 12/05/2023 8:46:46 AM This report has been signed electronically. Number of Addenda:  0 Note Initiated On: 12/05/2023 8:28 AM Estimated Blood Loss:  Estimated blood loss: none.      [facility]

## 2023-12-05 NOTE — Anesthesia Procedure Notes (Signed)
 Date/Time: 12/05/2023 8:36 AM  Performed by: Clementine Cutting, CRNAPre-anesthesia Checklist: Patient identified, Emergency Drugs available, Suction available, Patient being monitored and Timeout performed Patient Re-evaluated:Patient Re-evaluated prior to induction Oxygen Delivery Method: Simple face mask Induction Type: IV induction Placement Confirmation: CO2 detector and positive ETCO2

## 2023-12-06 LAB — SURGICAL PATHOLOGY

## 2023-12-07 ENCOUNTER — Ambulatory Visit: Payer: Self-pay | Admitting: Gastroenterology

## 2023-12-07 NOTE — Telephone Encounter (Signed)
 Please review.  KP

## 2023-12-17 ENCOUNTER — Other Ambulatory Visit: Payer: Self-pay | Admitting: Physician Assistant

## 2023-12-17 DIAGNOSIS — R42 Dizziness and giddiness: Secondary | ICD-10-CM

## 2023-12-20 ENCOUNTER — Telehealth: Payer: Self-pay | Admitting: Physician Assistant

## 2023-12-20 NOTE — Telephone Encounter (Signed)
 Erroneous encounter. Please disregard.

## 2023-12-21 NOTE — Telephone Encounter (Signed)
 Requested medication (s) are due for refill today: yes  Requested medication (s) are on the active medication list: yes  Last refill:  11/11/23  Future visit scheduled: no  Notes to clinic:  Unable to refill per protocol, cannot delegate. no     Requested Prescriptions  Pending Prescriptions Disp Refills   ondansetron  (ZOFRAN ) 8 MG tablet [Pharmacy Med Name: ONDANSETRON  8MG  TABLETS] 30 tablet 0    Sig: TAKE 1 TABLET(8 MG) BY MOUTH EVERY 8 HOURS AS NEEDED FOR NAUSEA     Not Delegated - Gastroenterology: Antiemetics - ondansetron  Failed - 12/21/2023 11:22 AM      Failed - This refill cannot be delegated      Passed - AST in normal range and within 360 days    AST  Date Value Ref Range Status  11/18/2023 18 0 - 40 IU/L Final         Passed - ALT in normal range and within 360 days    ALT  Date Value Ref Range Status  11/18/2023 14 0 - 44 IU/L Final         Passed - Valid encounter within last 6 months    Recent Outpatient Visits           1 month ago Epigastric pain   [place] [facility] at [facility], [place]leen [place]acer, [place]   3 months ago Syncope, unspecified syncope type   [facility] at [facility], Arleen Lacer, [place]

## 2024-01-05 DIAGNOSIS — Z419 Encounter for procedure for purposes other than remedying health state, unspecified: Secondary | ICD-10-CM | POA: Diagnosis not present

## 2024-01-06 DIAGNOSIS — R202 Paresthesia of skin: Secondary | ICD-10-CM | POA: Diagnosis not present

## 2024-01-06 DIAGNOSIS — R0609 Other forms of dyspnea: Secondary | ICD-10-CM | POA: Diagnosis not present

## 2024-01-06 DIAGNOSIS — R0683 Snoring: Secondary | ICD-10-CM | POA: Diagnosis not present

## 2024-01-06 DIAGNOSIS — Z1331 Encounter for screening for depression: Secondary | ICD-10-CM | POA: Diagnosis not present

## 2024-01-06 DIAGNOSIS — R55 Syncope and collapse: Secondary | ICD-10-CM | POA: Diagnosis not present

## 2024-01-06 DIAGNOSIS — R200 Anesthesia of skin: Secondary | ICD-10-CM | POA: Diagnosis not present

## 2024-01-06 DIAGNOSIS — R519 Headache, unspecified: Secondary | ICD-10-CM | POA: Diagnosis not present

## 2024-01-06 DIAGNOSIS — R569 Unspecified convulsions: Secondary | ICD-10-CM | POA: Diagnosis not present

## 2024-01-06 DIAGNOSIS — H9313 Tinnitus, bilateral: Secondary | ICD-10-CM | POA: Diagnosis not present

## 2024-01-06 DIAGNOSIS — I2089 Other forms of angina pectoris: Secondary | ICD-10-CM | POA: Diagnosis not present

## 2024-01-06 DIAGNOSIS — R531 Weakness: Secondary | ICD-10-CM | POA: Diagnosis not present

## 2024-01-06 DIAGNOSIS — R42 Dizziness and giddiness: Secondary | ICD-10-CM | POA: Diagnosis not present

## 2024-01-10 ENCOUNTER — Encounter: Payer: Self-pay | Admitting: Physician Assistant

## 2024-01-10 ENCOUNTER — Other Ambulatory Visit: Payer: Self-pay | Admitting: Physician Assistant

## 2024-01-10 DIAGNOSIS — R519 Headache, unspecified: Secondary | ICD-10-CM

## 2024-01-13 DIAGNOSIS — R55 Syncope and collapse: Secondary | ICD-10-CM | POA: Diagnosis not present

## 2024-01-13 DIAGNOSIS — I10 Essential (primary) hypertension: Secondary | ICD-10-CM | POA: Diagnosis not present

## 2024-01-13 DIAGNOSIS — I2089 Other forms of angina pectoris: Secondary | ICD-10-CM | POA: Diagnosis not present

## 2024-01-17 ENCOUNTER — Other Ambulatory Visit: Payer: Self-pay | Admitting: Physician Assistant

## 2024-01-17 DIAGNOSIS — J302 Other seasonal allergic rhinitis: Secondary | ICD-10-CM

## 2024-01-19 NOTE — Telephone Encounter (Signed)
 Requested Prescriptions  Pending Prescriptions Disp Refills   fluticasone  (FLONASE ) 50 MCG/ACT nasal spray [Pharmacy Med Name: FLUTICASONE  NASAL SP (120) RX] 16 g 6    Sig: INSTILL 2 SPRAYS INTO BOTH NOSTRILS DAILY.     Ear, Nose, and Throat: Nasal Preparations - Corticosteroids Passed - 01/19/2024  1:40 PM      Passed - Valid encounter within last 12 months    Recent Outpatient Visits           2 months ago Epigastric pain   [place] [facility] at [facility], Toribio SQUIBB, [place]   4 months ago Syncope, unspecified syncope type   [facility] at [facility], Toribio SQUIBB, [place]

## 2024-02-04 ENCOUNTER — Ambulatory Visit
Admission: RE | Admit: 2024-02-04 | Discharge: 2024-02-04 | Disposition: A | Discharge status: Home / Self Care | Source: Ambulatory Visit | Attending: Physician Assistant | Admitting: Physician Assistant

## 2024-02-04 ENCOUNTER — Other Ambulatory Visit: Payer: Self-pay | Admitting: Physician Assistant

## 2024-02-04 DIAGNOSIS — R519 Headache, unspecified: Secondary | ICD-10-CM | POA: Diagnosis not present

## 2024-02-04 DIAGNOSIS — F410 Panic disorder [episodic paroxysmal anxiety] without agoraphobia: Secondary | ICD-10-CM

## 2024-02-04 DIAGNOSIS — F332 Major depressive disorder, recurrent severe without psychotic features: Secondary | ICD-10-CM

## 2024-02-04 DIAGNOSIS — Z419 Encounter for procedure for purposes other than remedying health state, unspecified: Secondary | ICD-10-CM | POA: Diagnosis not present

## 2024-02-04 DIAGNOSIS — R4589 Other symptoms and signs involving emotional state: Secondary | ICD-10-CM

## 2024-02-04 DIAGNOSIS — R42 Dizziness and giddiness: Secondary | ICD-10-CM | POA: Diagnosis not present

## 2024-02-04 DIAGNOSIS — R200 Anesthesia of skin: Secondary | ICD-10-CM | POA: Diagnosis not present

## 2024-02-06 NOTE — Telephone Encounter (Signed)
 Requested Prescriptions  Pending Prescriptions Disp Refills   sertraline  (ZOLOFT ) 100 MG tablet [Pharmacy Med Name: SERTRALINE  100MG  TABLETS] 90 tablet 0    Sig: TAKE 1 TABLET(100 MG) BY MOUTH DAILY     Psychiatry:  Antidepressants - SSRI - sertraline  Passed - 02/06/2024  5:25 PM      Passed - AST in normal range and within 360 days    AST  Date Value Ref Range Status  11/18/2023 18 0 - 40 IU/L Final         Passed - ALT in normal range and within 360 days    ALT  Date Value Ref Range Status  11/18/2023 14 0 - 44 IU/L Final         Passed - Completed PHQ-2 or PHQ-9 in the last 360 days      Passed - Valid encounter within last 6 months    Recent Outpatient Visits           2 months ago Epigastric pain   [place] [facility] at [facility], Toribio [place]QU[place], [place]   5 months ago Syncope, unspecified syncope type   [facility] Medicine at [facility], Toribio SQUIBB, [place]

## 2024-02-13 ENCOUNTER — Other Ambulatory Visit: Payer: Self-pay | Admitting: Physician Assistant

## 2024-02-13 DIAGNOSIS — R200 Anesthesia of skin: Secondary | ICD-10-CM | POA: Diagnosis not present

## 2024-02-13 DIAGNOSIS — R42 Dizziness and giddiness: Secondary | ICD-10-CM

## 2024-02-15 ENCOUNTER — Other Ambulatory Visit: Payer: Self-pay

## 2024-02-15 NOTE — Telephone Encounter (Signed)
 Requested medication (s) are due for refill today: yes  Requested medication (s) are on the active medication list: yes  Last refill:  12/21/23  Future visit scheduled: no  Notes to clinic:  Unable to refill per protocol, cannot delegate.      Requested Prescriptions  Pending Prescriptions Disp Refills   ondansetron  (ZOFRAN ) 8 MG tablet [Pharmacy Med Name: ONDANSETRON  8MG  TABLETS] 30 tablet 0    Sig: TAKE 1 TABLET(8 MG) BY MOUTH EVERY 8 HOURS AS NEEDED FOR NAUSEA     Not Delegated - Gastroenterology: Antiemetics - ondansetron  Failed - 02/15/2024  4:04 PM      Failed - This refill cannot be delegated      Passed - AST in normal range and within 360 days    AST  Date Value Ref Range Status  11/18/2023 18 0 - 40 IU/L Final         Passed - ALT in normal range and within 360 days    ALT  Date Value Ref Range Status  11/18/2023 14 0 - 44 IU/L Final         Passed - Valid encounter within last 6 months    Recent Outpatient Visits           2 months ago Epigastric pain   [place] [facility] at [facility], Toribio [place]QU[place], [place]   5 months ago Syncope, unspecified syncope type   [facility] Medicine at [facility], Toribio SQUIBB, [place]

## 2024-02-22 DIAGNOSIS — M5412 Radiculopathy, cervical region: Secondary | ICD-10-CM | POA: Diagnosis not present

## 2024-02-22 DIAGNOSIS — M5416 Radiculopathy, lumbar region: Secondary | ICD-10-CM | POA: Diagnosis not present

## 2024-03-02 DIAGNOSIS — M25512 Pain in left shoulder: Secondary | ICD-10-CM | POA: Diagnosis not present

## 2024-03-06 DIAGNOSIS — Z419 Encounter for procedure for purposes other than remedying health state, unspecified: Secondary | ICD-10-CM | POA: Diagnosis not present

## 2024-03-09 DIAGNOSIS — M5416 Radiculopathy, lumbar region: Secondary | ICD-10-CM | POA: Diagnosis not present

## 2024-03-09 DIAGNOSIS — M545 Low back pain, unspecified: Secondary | ICD-10-CM | POA: Diagnosis not present

## 2024-03-16 DIAGNOSIS — Z7689 Persons encountering health services in other specified circumstances: Secondary | ICD-10-CM | POA: Diagnosis not present

## 2024-03-16 DIAGNOSIS — M545 Low back pain, unspecified: Secondary | ICD-10-CM | POA: Diagnosis not present

## 2024-03-16 DIAGNOSIS — M5416 Radiculopathy, lumbar region: Secondary | ICD-10-CM | POA: Diagnosis not present

## 2024-03-17 ENCOUNTER — Other Ambulatory Visit: Payer: Self-pay | Admitting: Physician Assistant

## 2024-03-17 DIAGNOSIS — I10 Essential (primary) hypertension: Secondary | ICD-10-CM

## 2024-03-17 DIAGNOSIS — R42 Dizziness and giddiness: Secondary | ICD-10-CM

## 2024-03-19 ENCOUNTER — Other Ambulatory Visit: Payer: Self-pay

## 2024-03-19 NOTE — Telephone Encounter (Signed)
 Requested medication (s) are due for refill today: yes  Requested medication (s) are on the active medication list: yes  Last refill:  02/15/24  Future visit scheduled: no  Notes to clinic:  Unable to refill per protocol, cannot delegate.      Requested Prescriptions  Pending Prescriptions Disp Refills   ondansetron  (ZOFRAN ) 8 MG tablet [Pharmacy Med Name: ONDANSETRON  8MG  TABLETS] 30 tablet 0    Sig: TAKE 1 TABLET(8 MG) BY MOUTH EVERY 8 HOURS AS NEEDED FOR NAUSEA     Not Delegated - Gastroenterology: Antiemetics - ondansetron  Failed - 03/19/2024  3:44 PM      Failed - This refill cannot be delegated      Failed - Valid encounter within last 6 months    Recent Outpatient Visits           4 months ago Epigastric pain   [place] [facility] at [facility], Toribio SQU[place], [place]   6 months ago Syncope, unspecified syncope type   [facility] at [facility], Toribio [place]QU[place], [place]              Passed - AST in normal range and within 360 days    AST  Date Value Ref Range Status  11/18/2023 18 0 - 40 IU/L Final         Passed - ALT in normal range and within 360 days    ALT  Date Value Ref Range Status  11/18/2023 14 0 - 44 IU/L Final         Signed Prescriptions Disp Refills   losartan  (COZAAR ) 25 MG tablet 90 tablet 0    Sig: TAKE 1 TABLET(25 MG) BY MOUTH DAILY     Cardiovascular:  Angiotensin Receptor Blockers Failed - 03/19/2024  3:44 PM      Failed - Valid encounter within last 6 months    Recent Outpatient Visits           4 months ago Epigastric pain   [place] [facility] at [facility], Toribio SQU[place]B, [place]   6 months ago Syncope, unspecified syncope type   [facility] Medicine at [facility], Toribio SQUIBB, [place]              Passed - Cr in normal range and within 180 days    Creatinine  Date Value Ref Range Status  08/31/2014 1.17  0.60 - 1.30 mg/dL Final   Creatinine, Ser  Date Value Ref Range Status  11/18/2023 1.10 0.76 - 1.27 mg/dL Final         Passed - K in normal range and within 180 days    Potassium  Date Value Ref Range Status  11/18/2023 4.9 3.5 - 5.2 mmol/L Final  08/31/2014 3.8 3.5 - 5.1 mmol/L Final         Passed - Patient is not pregnant      Passed - Last BP in normal range    BP Readings from Last 1 Encounters:  12/05/23 103/71

## 2024-03-19 NOTE — Telephone Encounter (Signed)
 Requested Prescriptions  Pending Prescriptions Disp Refills   losartan  (COZAAR ) 25 MG tablet [Pharmacy Med Name: LOSARTAN  25MG  TABLETS] 90 tablet 0    Sig: TAKE 1 TABLET(25 MG) BY MOUTH DAILY     Cardiovascular:  Angiotensin Receptor Blockers Failed - 03/19/2024  3:43 PM      Failed - Valid encounter within last 6 months    Recent Outpatient Visits           4 months ago Epigastric pain   [place] [facility] at [facility], Toribio [place]QU[place], [place]   6 months ago Syncope, unspecified syncope type   [facility] at [facility], Toribio SQUIBB, [place]              Passed - Cr in normal range and within 180 days    Creatinine  Date Value Ref Range Status  08/31/2014 1.17 0.60 - 1.30 mg/dL Final   Creatinine, Ser  Date Value Ref Range Status  11/18/2023 1.10 0.76 - 1.27 mg/dL Final         Passed - K in normal range and within 180 days    Potassium  Date Value Ref Range Status  11/18/2023 4.9 3.5 - 5.2 mmol/L Final  08/31/2014 3.8 3.5 - 5.1 mmol/L Final         Passed - Patient is not pregnant      Passed - Last BP in normal range    BP Readings from Last 1 Encounters:  12/05/23 103/71          ondansetron  (ZOFRAN ) 8 MG tablet [Pharmacy Med Name: ONDANSETRON  8MG  TABLETS] 30 tablet 0    Sig: TAKE 1 TABLET(8 MG) BY MOUTH EVERY 8 HOURS AS NEEDED FOR NAUSEA     Not Delegated - Gastroenterology: Antiemetics - ondansetron  Failed - 03/19/2024  3:43 PM      Failed - This refill cannot be delegated      Failed - Valid encounter within last 6 months    Recent Outpatient Visits           4 months ago Epigastric pain    [facility] & [facility] Medicine at [facility], Toribio [place], [place]   6 months ago Syncope, unspecified syncope type   [facility] at [facility], Toribio [place]QU[place], [place]              Passed - AST in normal range and within 360 days    AST  Date  Value Ref Range Status  11/18/2023 18 0 - 40 IU/L Final         Passed - ALT in normal range and within 360 days    ALT  Date Value Ref Range Status  11/18/2023 14 0 - 44 IU/L Final

## 2024-03-20 DIAGNOSIS — M545 Low back pain, unspecified: Secondary | ICD-10-CM | POA: Diagnosis not present

## 2024-03-20 DIAGNOSIS — Z7689 Persons encountering health services in other specified circumstances: Secondary | ICD-10-CM | POA: Diagnosis not present

## 2024-03-20 DIAGNOSIS — M5416 Radiculopathy, lumbar region: Secondary | ICD-10-CM | POA: Diagnosis not present

## 2024-03-20 DIAGNOSIS — M5412 Radiculopathy, cervical region: Secondary | ICD-10-CM | POA: Diagnosis not present

## 2024-03-27 DIAGNOSIS — M545 Low back pain, unspecified: Secondary | ICD-10-CM | POA: Diagnosis not present

## 2024-03-27 DIAGNOSIS — Z7689 Persons encountering health services in other specified circumstances: Secondary | ICD-10-CM | POA: Diagnosis not present

## 2024-03-27 DIAGNOSIS — M5416 Radiculopathy, lumbar region: Secondary | ICD-10-CM | POA: Diagnosis not present

## 2024-03-30 DIAGNOSIS — M5416 Radiculopathy, lumbar region: Secondary | ICD-10-CM | POA: Diagnosis not present

## 2024-03-30 DIAGNOSIS — M545 Low back pain, unspecified: Secondary | ICD-10-CM | POA: Diagnosis not present

## 2024-04-06 DIAGNOSIS — M5416 Radiculopathy, lumbar region: Secondary | ICD-10-CM | POA: Diagnosis not present

## 2024-04-06 DIAGNOSIS — Z419 Encounter for procedure for purposes other than remedying health state, unspecified: Secondary | ICD-10-CM | POA: Diagnosis not present

## 2024-04-06 DIAGNOSIS — M545 Low back pain, unspecified: Secondary | ICD-10-CM | POA: Diagnosis not present

## 2024-04-13 DIAGNOSIS — M5416 Radiculopathy, lumbar region: Secondary | ICD-10-CM | POA: Diagnosis not present

## 2024-04-13 DIAGNOSIS — M545 Low back pain, unspecified: Secondary | ICD-10-CM | POA: Diagnosis not present

## 2024-04-26 ENCOUNTER — Other Ambulatory Visit: Payer: Self-pay

## 2024-04-26 ENCOUNTER — Emergency Department
Admission: EM | Admit: 2024-04-26 | Discharge: 2024-04-26 | Disposition: A | Discharge status: Home / Self Care | Attending: Emergency Medicine | Admitting: Emergency Medicine

## 2024-04-26 ENCOUNTER — Emergency Department

## 2024-04-26 DIAGNOSIS — Z743 Need for continuous supervision: Secondary | ICD-10-CM | POA: Diagnosis not present

## 2024-04-26 DIAGNOSIS — R1011 Right upper quadrant pain: Secondary | ICD-10-CM | POA: Diagnosis not present

## 2024-04-26 DIAGNOSIS — R0602 Shortness of breath: Secondary | ICD-10-CM | POA: Diagnosis not present

## 2024-04-26 DIAGNOSIS — K769 Liver disease, unspecified: Secondary | ICD-10-CM | POA: Insufficient documentation

## 2024-04-26 DIAGNOSIS — R079 Chest pain, unspecified: Secondary | ICD-10-CM | POA: Diagnosis not present

## 2024-04-26 DIAGNOSIS — R1013 Epigastric pain: Secondary | ICD-10-CM | POA: Diagnosis present

## 2024-04-26 DIAGNOSIS — R109 Unspecified abdominal pain: Secondary | ICD-10-CM

## 2024-04-26 DIAGNOSIS — R1084 Generalized abdominal pain: Secondary | ICD-10-CM | POA: Diagnosis not present

## 2024-04-26 DIAGNOSIS — R42 Dizziness and giddiness: Secondary | ICD-10-CM | POA: Diagnosis not present

## 2024-04-26 LAB — COMPREHENSIVE METABOLIC PANEL WITH GFR
ALT: 19 U/L (ref 0–44)
AST: 20 U/L (ref 15–41)
Albumin: 4.6 g/dL (ref 3.5–5.0)
Alkaline Phosphatase: 71 U/L (ref 38–126)
Anion gap: 10 (ref 5–15)
BUN: 14 mg/dL (ref 6–20)
CO2: 25 mmol/L (ref 22–32)
Calcium: 9.4 mg/dL (ref 8.9–10.3)
Chloride: 103 mmol/L (ref 98–111)
Creatinine, Ser: 1.01 mg/dL (ref 0.61–1.24)
GFR, Estimated: >60 mL/min (ref >60)
Glucose, Bld: 94 mg/dL (ref 70–99)
Potassium: 4 mmol/L (ref 3.5–5.1)
Sodium: 138 mmol/L (ref 135–145)
Total Bilirubin: 0.9 mg/dL (ref 0.0–1.2)
Total Protein: 8 g/dL (ref 6.5–8.1)

## 2024-04-26 LAB — URINALYSIS, ROUTINE W REFLEX MICROSCOPIC
Bilirubin Urine: NEGATIVE
Glucose, UA: NEGATIVE mg/dL
Hgb urine dipstick: NEGATIVE
Ketones, ur: NEGATIVE mg/dL
Leukocytes,Ua: NEGATIVE
Nitrite: NEGATIVE
Protein, ur: NEGATIVE mg/dL
Specific Gravity, Urine: 1.002 — ABNORMAL LOW (ref 1.005–1.030)
pH: 7 (ref 5.0–8.0)

## 2024-04-26 LAB — CBC
HCT: 46.1 % (ref 39.0–52.0)
Hemoglobin: 16.1 g/dL (ref 13.0–17.0)
MCH: 31.2 pg (ref 26.0–34.0)
MCHC: 34.9 g/dL (ref 30.0–36.0)
MCV: 89.3 fL (ref 80.0–100.0)
Platelets: 230 K/uL (ref 150–400)
RBC: 5.16 MIL/uL (ref 4.22–5.81)
RDW: 12.1 % (ref 11.5–15.5)
WBC: 5 K/uL (ref 4.0–10.5)
nRBC: 0 % (ref 0.0–0.2)

## 2024-04-26 LAB — LIPASE, BLOOD: Lipase: 519 U/L — ABNORMAL HIGH (ref 11–51)

## 2024-04-26 NOTE — ED Notes (Signed)
 Pt given Dc instructions by this Rn. Pt verbalized understanding of follow up care. Pt ambulatory from ED without difficulty.

## 2024-04-26 NOTE — ED Provider Notes (Signed)
 [facility] Provider Note    Event Date/Time   First MD Initiated Contact with Patient 04/26/24 1608     (approximate)   History   Abdominal pain   HPI  Carlon Chaloux is a 43 y.o. male  who presents to the emergency department today because of concern for epigastric and RUQ pain. Symptoms have been present for a long time. He says that he has been diagnosed with GERD in the past. Has also felt like he has had difficulty swallowing. Was wondering if anyone has looked at his lungs in the past. He also complains of tingling throughout his body.       Physical Exam   Triage Vital Signs: ED Triage Vitals  Encounter Vitals Group     BP 04/26/24 1612 (!) 130/92     Girls Systolic BP Percentile --      Girls Diastolic BP Percentile --      Boys Systolic BP Percentile --      Boys Diastolic BP Percentile --      Pulse Rate 04/26/24 1612 60     Resp 04/26/24 1612 12     Temp 04/26/24 1612 97.6 F (36.4 C)     Temp Source 04/26/24 1612 Oral     SpO2 04/26/24 1612 100 %     Weight 04/26/24 1610 215 lb (97.5 kg)     Height 04/26/24 1610 6' 2 (1.88 m)     Head Circumference --      Peak Flow --      Pain Score --      Pain Loc --      Pain Education --      Exclude from Growth Chart --     Most recent vital signs: Vitals:   04/26/24 1612  BP: (!) 130/92  Pulse: 60  Resp: 12  Temp: 97.6 F (36.4 C)  SpO2: 100%   General: Awake, alert, oriented. CV:  Good peripheral perfusion. Regular rate and rhythm. Resp:  Normal effort. Lungs clear. Abd:  No distention. Tender to palpation in the epigastric and RUQ.   ED Results / Procedures / Treatments   Labs (all labs ordered are listed, but only abnormal results are displayed) Labs Reviewed  URINALYSIS, ROUTINE W REFLEX MICROSCOPIC - Abnormal; Notable for the following components:      Result Value   Color, Urine COLORLESS (*)    APPearance CLEAR (*)    Specific Gravity, Urine 1.002 (*)     All other components within normal limits  COMPREHENSIVE METABOLIC PANEL WITH GFR  CBC  LIPASE, BLOOD     EKG  I, Guadalupe Eagles, attending physician, personally viewed and interpreted this EKG  EKG Time: 1607 Rate: 67 Rhythm: sinus rhythm Axis: normal Intervals: qtc 435 QRS: narrow, low voltage ST changes: no st elevation Impression: abnormal ekg   RADIOLOGY I independently interpreted and visualized the RUQ US . My interpretation: No gallstones Radiology interpretation:  IMPRESSION:  1. Heterogeneous liver echotexture with subtle hypoechogenicities;  indeterminate for underlying masses versus benign fatty sparing.consider  hepatic protocol MRI with and without contrast for definitive characterization.  2. Index lesion in the right hepatic lobe measuring 1.6 x 1.1 x 1.4 cm     PROCEDURES:  Critical Care performed: No   MEDICATIONS ORDERED IN ED: Medications - No data to display   IMPRESSION / MDM / ASSESSMENT AND PLAN / ED COURSE  I reviewed the triage vital signs and the nursing notes.  Differential diagnosis includes, but is not limited to, gastritis, duodenitis, gallbladder disease  Patient's presentation is most consistent with acute presentation with potential threat to life or bodily function.  Patient presented to the emergency department today because of concern for epigastric and RUQ pain which has been present for a long time. Patient reports extensive work up for his symptoms in the past. On exam tender in those areas, abdomen soft. It does not look like anyone has looked directly hat his GB at this time. Will get US .  US  did not show gallstones, however did show concerning liver lesions. Discussed this with the patient. Discussed importance of follow up with his PCP for liver MRI.      FINAL CLINICAL IMPRESSION(S) / ED DIAGNOSES   Final diagnoses:  Abdominal pain, unspecified abdominal location  Liver disease         Note:  This document was prepared using Dragon voice recognition software and may include unintentional dictation errors.    Floy Roberts, MD 04/26/24 TYRA

## 2024-04-26 NOTE — ED Triage Notes (Signed)
 Pt BIB EMS from home with complaints of RUQ pain that started 2 weeks ago and worsened today. Pt states he was dx with GERD about a month ago. Pt is also complaining of some chest pressure. Pt states he is also having pain in his lungs and throat.

## 2024-04-26 NOTE — Discharge Instructions (Signed)
 It is very important that you call your primary care so you can get further imaging on your liver.

## 2024-04-27 ENCOUNTER — Emergency Department: Admission: EM | Admit: 2024-04-27 | Discharge: 2024-04-27 | Disposition: A | Discharge status: Home / Self Care

## 2024-04-27 ENCOUNTER — Emergency Department

## 2024-04-27 ENCOUNTER — Encounter: Payer: Self-pay | Admitting: Emergency Medicine

## 2024-04-27 ENCOUNTER — Other Ambulatory Visit: Payer: Self-pay

## 2024-04-27 ENCOUNTER — Telehealth: Payer: Self-pay | Admitting: Emergency Medicine

## 2024-04-27 DIAGNOSIS — K859 Acute pancreatitis without necrosis or infection, unspecified: Secondary | ICD-10-CM | POA: Insufficient documentation

## 2024-04-27 DIAGNOSIS — K7689 Other specified diseases of liver: Secondary | ICD-10-CM | POA: Diagnosis not present

## 2024-04-27 DIAGNOSIS — R1013 Epigastric pain: Secondary | ICD-10-CM | POA: Diagnosis present

## 2024-04-27 DIAGNOSIS — R1010 Upper abdominal pain, unspecified: Secondary | ICD-10-CM | POA: Diagnosis not present

## 2024-04-27 DIAGNOSIS — R748 Abnormal levels of other serum enzymes: Secondary | ICD-10-CM | POA: Diagnosis not present

## 2024-04-27 DIAGNOSIS — K828 Other specified diseases of gallbladder: Secondary | ICD-10-CM | POA: Diagnosis not present

## 2024-04-27 LAB — URINALYSIS, COMPLETE (UACMP) WITH MICROSCOPIC
Bacteria, UA: NONE SEEN
Bilirubin Urine: NEGATIVE
Glucose, UA: NEGATIVE mg/dL
Hgb urine dipstick: NEGATIVE
Ketones, ur: NEGATIVE mg/dL
Leukocytes,Ua: NEGATIVE
Nitrite: NEGATIVE
Protein, ur: NEGATIVE mg/dL
RBC / HPF: 0 RBC/hpf (ref 0–5)
Specific Gravity, Urine: 1.012 (ref 1.005–1.030)
Squamous Epithelial / HPF: 0 /HPF (ref 0–5)
pH: 6 (ref 5.0–8.0)

## 2024-04-27 LAB — CBC WITH DIFFERENTIAL/PLATELET
Abs Immature Granulocytes: 0.02 K/uL (ref 0.00–0.07)
Basophils Absolute: 0 K/uL (ref 0.0–0.1)
Basophils Relative: 1 %
Eosinophils Absolute: 0.1 K/uL (ref 0.0–0.5)
Eosinophils Relative: 1 %
HCT: 43.9 % (ref 39.0–52.0)
Hemoglobin: 15.5 g/dL (ref 13.0–17.0)
Immature Granulocytes: 0 %
Lymphocytes Relative: 33 %
Lymphs Abs: 2 K/uL (ref 0.7–4.0)
MCH: 31.3 pg (ref 26.0–34.0)
MCHC: 35.3 g/dL (ref 30.0–36.0)
MCV: 88.7 fL (ref 80.0–100.0)
Monocytes Absolute: 0.5 K/uL (ref 0.1–1.0)
Monocytes Relative: 8 %
Neutro Abs: 3.5 K/uL (ref 1.7–7.7)
Neutrophils Relative %: 57 %
Platelets: 225 K/uL (ref 150–400)
RBC: 4.95 MIL/uL (ref 4.22–5.81)
RDW: 12.2 % (ref 11.5–15.5)
WBC: 6.2 K/uL (ref 4.0–10.5)
nRBC: 0 % (ref 0.0–0.2)

## 2024-04-27 LAB — LIPID PANEL
Cholesterol: 169 mg/dL (ref 0–200)
HDL: 56 mg/dL (ref >40)
LDL Cholesterol: 103 mg/dL — ABNORMAL HIGH (ref 0–99)
Total CHOL/HDL Ratio: 3 ratio
Triglycerides: 52 mg/dL (ref <150)
VLDL: 10 mg/dL (ref 0–40)

## 2024-04-27 LAB — COMPREHENSIVE METABOLIC PANEL WITH GFR
ALT: 16 U/L (ref 0–44)
AST: 22 U/L (ref 15–41)
Albumin: 3.7 g/dL (ref 3.5–5.0)
Alkaline Phosphatase: 56 U/L (ref 38–126)
Anion gap: 10 (ref 5–15)
BUN: 10 mg/dL (ref 6–20)
CO2: 25 mmol/L (ref 22–32)
Calcium: 8.8 mg/dL — ABNORMAL LOW (ref 8.9–10.3)
Chloride: 103 mmol/L (ref 98–111)
Creatinine, Ser: 1.05 mg/dL (ref 0.61–1.24)
GFR, Estimated: >60 mL/min (ref >60)
Glucose, Bld: 139 mg/dL — ABNORMAL HIGH (ref 70–99)
Potassium: 3.6 mmol/L (ref 3.5–5.1)
Sodium: 138 mmol/L (ref 135–145)
Total Bilirubin: 0.9 mg/dL (ref 0.0–1.2)
Total Protein: 6.9 g/dL (ref 6.5–8.1)

## 2024-04-27 LAB — LIPASE, BLOOD: Lipase: 31 U/L (ref 11–51)

## 2024-04-27 MED ORDER — ACETAMINOPHEN 500 MG PO TABS: 1000.0000 mg | ORAL_TABLET | Freq: Four times a day (QID) | ORAL | 2 refills | Status: DC | PRN

## 2024-04-27 MED ORDER — IBUPROFEN 200 MG PO TABS: 600.0000 mg | ORAL_TABLET | Freq: Three times a day (TID) | ORAL | 2 refills | Status: DC | PRN

## 2024-04-27 MED ORDER — ONDANSETRON 4 MG PO TBDP: 4.0000 mg | ORAL_TABLET | Freq: Three times a day (TID) | ORAL | 0 refills | Status: DC | PRN

## 2024-04-27 MED ORDER — OXYCODONE HCL 5 MG PO TABS: 5.0000 mg | ORAL_TABLET | Freq: Three times a day (TID) | ORAL | 0 refills | Status: DC | PRN

## 2024-04-27 MED ORDER — IOHEXOL 300 MG/ML  SOLN
100.0000 mL | Freq: Once | INTRAMUSCULAR | Status: AC | PRN
  Administered 2024-04-27: 100 mL via INTRAVENOUS

## 2024-04-27 MED ORDER — MORPHINE SULFATE (PF) 4 MG/ML IV SOLN
4.0000 mg | Freq: Once | INTRAVENOUS | Status: AC
  Administered 2024-04-27: 4 mg via INTRAVENOUS
  Filled 2024-04-27: qty 1

## 2024-04-27 MED ORDER — ONDANSETRON HCL 4 MG/2ML IJ SOLN
4.0000 mg | Freq: Once | INTRAMUSCULAR | Status: AC
  Administered 2024-04-27: 4 mg via INTRAVENOUS
  Filled 2024-04-27: qty 2

## 2024-04-27 MED ORDER — KETOROLAC TROMETHAMINE 15 MG/ML IJ SOLN
15.0000 mg | Freq: Once | INTRAMUSCULAR | Status: AC
  Administered 2024-04-27: 15 mg via INTRAVENOUS
  Filled 2024-04-27: qty 1

## 2024-04-27 NOTE — ED Notes (Signed)
 Patient ambulated to toilet with no assistance. No complaints of dizziness.

## 2024-04-27 NOTE — ED Notes (Signed)
 Patient transported to CT

## 2024-04-27 NOTE — ED Provider Notes (Signed)
 [facility] Provider Note    Event Date/Time   First MD Initiated Contact with Patient 04/27/24 1118     (approximate)   History   Abnormal Lab  Pt called and told his lipase was elevated (519). Endorses upper abd pain.    HPI Jacob Mckinney is a 43 y.o. male presents for evaluation of abnormal lab value -Patient was seen in the emergency department for epigastric and right upper quadrant pain.  Right upper quadrant ultrasound with no cholelithiasis/evidence of cholecystitis, possible liver lesions, discharged home with plan to follow-up with MRI and consider outpatient liver MRI.  Long delay in lipase due to laboratory issues recently, subsequently came back elevated, patient was notified and told to come to emergency department for reeval. - On my evaluation.  Patient notes he has had severe epigastric pain since yesterday.  Former drinker, says he has not had any alcohol in about 6 months.  No prior history of pancreatitis.  Some nausea, no vomiting.  No fevers.  No lower abdominal pain.  Does endorse some darkening urine that is malodorous. -No chest pain or shortness of breath -Has not taken anything for pain today -Separately notes he has been having some difficulty with movement, feels like it is stuck when he tries to eat this and ends up regurgitating it shortly after.  He is tolerating liquids and other foods.  Currently asymptomatic in this regard.     Physical Exam   Triage Vital Signs: ED Triage Vitals  Encounter Vitals Group     BP 04/27/24 1109 (!) 122/90     Girls Systolic BP Percentile --      Girls Diastolic BP Percentile --      Boys Systolic BP Percentile --      Boys Diastolic BP Percentile --      Pulse Rate 04/27/24 1109 82     Resp 04/27/24 1109 18     Temp 04/27/24 1109 98.1 F (36.7 C)     Temp Source 04/27/24 1109 Oral     SpO2 04/27/24 1109 94 %     Weight 04/27/24 1108 214 lb 15.2 oz (97.5 kg)     Height 04/27/24  1108 6' 2 (1.88 m)     Head Circumference --      Peak Flow --      Pain Score 04/27/24 1108 7     Pain Loc --      Pain Education --      Exclude from Growth Chart --     Most recent vital signs: Vitals:   04/27/24 1400 04/27/24 1453  BP: 118/87 115/87  Pulse: 64 63  Resp:  17  Temp:  98.4 F (36.9 C)  SpO2: 96% 99%     General: Awake, no distress.  CV:  Good peripheral perfusion. RRR, RP 2+ Resp:  Normal effort. CTAB Abd:  No distention.  Moderate tenderness to palpation in epigastrium only    ED Results / Procedures / Treatments   Labs (all labs ordered are listed, but only abnormal results are displayed) Labs Reviewed  COMPREHENSIVE METABOLIC PANEL WITH GFR - Abnormal; Notable for the following components:      Result Value   Glucose, Bld 139 (*)    Calcium 8.8 (*)    All other components within normal limits  URINALYSIS, COMPLETE (UACMP) WITH MICROSCOPIC - Abnormal; Notable for the following components:   Color, Urine COLORLESS (*)    APPearance CLEAR (*)  All other components within normal limits  LIPID PANEL - Abnormal; Notable for the following components:   LDL Cholesterol 103 (*)    All other components within normal limits  CBC WITH DIFFERENTIAL/PLATELET  LIPASE, BLOOD     EKG  N/a   RADIOLOGY Radiology interpreted by myself and radiology report reviewed.  No acute pathology identified.    PROCEDURES:  Critical Care performed: No  Procedures   MEDICATIONS ORDERED IN ED: Medications  morphine (PF) 4 MG/ML injection 4 mg (4 mg Intravenous Given 04/27/24 1143)  ketorolac (TORADOL) 15 MG/ML injection 15 mg (15 mg Intravenous Given 04/27/24 1142)  ondansetron  (ZOFRAN ) injection 4 mg (4 mg Intravenous Given 04/27/24 1143)  iohexol  (OMNIPAQUE ) 300 MG/ML solution 100 mL (100 mLs Intravenous Contrast Given 04/27/24 1153)     IMPRESSION / MDM / ASSESSMENT AND PLAN / ED COURSE  I reviewed the triage vital signs and the nursing notes.                               DDX/MDM/AP: Differential diagnosis includes, but is not limited to, pancreatitis of unclear etiology (doubt gallstone pancreatitis given reassuring right upper quadrant ultrasound yesterday), consider pancreatic mass.  Consider concomitant UTI.  Plan: - Labs - Pain control - CT abdomen pelvis  Patient's presentation is most consistent with acute presentation with potential threat to life or bodily function.  The patient is on the cardiac monitor to evaluate for evidence of arrhythmia and/or significant heart rate changes.  ED course below.  Workup unremarkable, lipase pending but feeling much better after initial pain medications.  Tolerating p.o. without difficulty.  No evidence of complications on CT, no indication for admission at this time.  Clinical Course as of 04/27/24 1613  Fri Apr 27, 2024  1208 CBC reviewed, unremarkable  Hepatobiliary labs normal  Lipase pending [MM]  1249 CTAP: IMPRESSION: 1. No acute abnormality. 2. No evidence of pancreatitis. 3. Tiny liver cyst.   [MM]  1249 Lipid panel overall unremarkable [MM]  1515 Patient reevaluated, feeling much better after initial medications, no ongoing discomfort.  Discussed reassuring workup today including CT abdomen pelvis with no evidence of pancreatitis and importantly no visualized mass or other significant abnormality.  Suspect patient may have mild pancreatitis given lipase elevations though no indication for admission at this time given he feels very well and is tolerating p.o. without difficulty with no evidence of any complications at this time.  Regarding his sensation of esophageal foreign bodies at times, recommend he chew all food thoroughly, maintaining soft food diet when possible, and follow-up with his gastroenterologist who had previously done an esophageal dilatation.  Fortunately does not have the symptoms now and is tolerating p.o. without difficulty. [MM]    Clinical Course  User Index [MM] Clarine Ozell LABOR, MD     FINAL CLINICAL IMPRESSION(S) / ED DIAGNOSES   Final diagnoses:  Acute pancreatitis without infection or necrosis, unspecified pancreatitis type     Rx / DC Orders   ED Discharge Orders          Ordered    acetaminophen (TYLENOL) 500 MG tablet  Every 6 hours PRN        04/27/24 1540    ibuprofen  (MOTRIN  IB) 200 MG tablet  Every 8 hours PRN        04/27/24 1540    ondansetron  (ZOFRAN -ODT) 4 MG disintegrating tablet  Every 8 hours PRN  04/27/24 1540    oxyCODONE (ROXICODONE) 5 MG immediate release tablet  Every 8 hours PRN        04/27/24 1540             Note:  This document was prepared using Dragon voice recognition software and may include unintentional dictation errors.   Clarine Ozell LABOR, MD 04/27/24 [phone]

## 2024-04-27 NOTE — Telephone Encounter (Signed)
 Called patient due to elevated lipase resulted after ED discharge.  Per Dr. Waymond patient should return if symptomatic--abd pain or nausea/vomiting.  Can do OP follow up if asymptomatic.  Patient states the pain never goes away and stomach continually grumbles.  I explained elevated lipase and need to return for recheck.  He says he has to go to nerve doctor now and will come after that.

## 2024-04-27 NOTE — Discharge Instructions (Addendum)
 Your evaluation in the emergency department was overall reassuring.  I do suspect you have mild pancreatitis, and I have prescribed you several medications to help with pain and nausea.  Please do follow-up with your primary care provider for reevaluation, and return to the emergency department with any new or worsening symptoms.  I do also recommend you follow-up with your gastroenterologist regarding your sensation of stuck food.

## 2024-04-27 NOTE — ED Triage Notes (Signed)
 Pt called and told his lipase was elevated (519). Endorses upper abd pain.

## 2024-05-06 DIAGNOSIS — Z419 Encounter for procedure for purposes other than remedying health state, unspecified: Secondary | ICD-10-CM | POA: Diagnosis not present

## 2024-05-08 DIAGNOSIS — R202 Paresthesia of skin: Secondary | ICD-10-CM | POA: Diagnosis not present

## 2024-05-08 DIAGNOSIS — M5416 Radiculopathy, lumbar region: Secondary | ICD-10-CM | POA: Diagnosis not present

## 2024-05-08 DIAGNOSIS — R531 Weakness: Secondary | ICD-10-CM | POA: Diagnosis not present

## 2024-05-08 DIAGNOSIS — Z7689 Persons encountering health services in other specified circumstances: Secondary | ICD-10-CM | POA: Diagnosis not present

## 2024-05-08 DIAGNOSIS — M545 Low back pain, unspecified: Secondary | ICD-10-CM | POA: Diagnosis not present

## 2024-05-08 DIAGNOSIS — R200 Anesthesia of skin: Secondary | ICD-10-CM | POA: Diagnosis not present

## 2024-05-10 ENCOUNTER — Other Ambulatory Visit: Payer: Self-pay | Admitting: Physician Assistant

## 2024-05-10 ENCOUNTER — Ambulatory Visit (INDEPENDENT_AMBULATORY_CARE_PROVIDER_SITE_OTHER): Admitting: Physician Assistant

## 2024-05-10 ENCOUNTER — Encounter: Payer: Self-pay | Admitting: Physician Assistant

## 2024-05-10 VITALS — BP 140/96 | HR 69 | Temp 98.0°F | Ht 74.0 in | Wt 218.0 lb

## 2024-05-10 DIAGNOSIS — M950 Acquired deformity of nose: Secondary | ICD-10-CM | POA: Diagnosis not present

## 2024-05-10 DIAGNOSIS — G4700 Insomnia, unspecified: Secondary | ICD-10-CM | POA: Diagnosis not present

## 2024-05-10 DIAGNOSIS — I10 Essential (primary) hypertension: Secondary | ICD-10-CM

## 2024-05-10 DIAGNOSIS — K2100 Gastro-esophageal reflux disease with esophagitis, without bleeding: Secondary | ICD-10-CM

## 2024-05-10 DIAGNOSIS — F332 Major depressive disorder, recurrent severe without psychotic features: Secondary | ICD-10-CM

## 2024-05-10 DIAGNOSIS — R94131 Abnormal electromyogram [EMG]: Secondary | ICD-10-CM

## 2024-05-10 DIAGNOSIS — F410 Panic disorder [episodic paroxysmal anxiety] without agoraphobia: Secondary | ICD-10-CM

## 2024-05-10 DIAGNOSIS — R42 Dizziness and giddiness: Secondary | ICD-10-CM

## 2024-05-10 DIAGNOSIS — M5412 Radiculopathy, cervical region: Secondary | ICD-10-CM

## 2024-05-10 DIAGNOSIS — J302 Other seasonal allergic rhinitis: Secondary | ICD-10-CM

## 2024-05-10 DIAGNOSIS — R4589 Other symptoms and signs involving emotional state: Secondary | ICD-10-CM

## 2024-05-10 MED ORDER — PROPRANOLOL HCL 20 MG PO TABS
20.0000 mg | ORAL_TABLET | Freq: Three times a day (TID) | ORAL | 0 refills | Status: DC
Start: 2024-05-10 — End: 2024-05-25

## 2024-05-10 MED ORDER — PANTOPRAZOLE SODIUM 40 MG PO TBEC: 40.0000 mg | DELAYED_RELEASE_TABLET | Freq: Every day | ORAL | 2 refills | Status: AC

## 2024-05-10 NOTE — Progress Notes (Signed)
 Date:  05/10/2024   Name:  Jacob Mckinney   DOB:  20-Feb-1981   MRN:  [id number]   Chief Complaint: Tinnitus (Right ear, comes and goes, for months ), Pancreatitis (Went to ER, said he has a spot on liver, wanted to know about MRI, still having stomach pain ), and Hypertension (Having headaches, stopped taken for 2 days headache stopped, the started back after continuing to take )  HPI Jacob Mckinney returns for follow-up on chronic conditions, has been seeing multiple specialists in recent months and staying on top of his appointments.  Due to his numerous recent health concerns and pain, he is not presently employed but is eager to get back to work when possible.  He was diagnosed with acute pancreatitis after presenting to the ED with severe intractable abdominal pain on 04/26/2024 with lipase 519.  Though the etiology was not identified and workup was otherwise unremarkable, thankfully it seems this spontaneously resolved, with repeat lipase of 31 the next day with subjective improvement in symptoms. Feels better from this at the time of our visit.   Requesting refill on pantoprazole  which is effective for controlling GERD symptoms. In October 2024, EGD was performed for dysphagia with esophageal dilatation completed. EGD at that time was significant for gastritis and was biopsied - pathology showed reactive gastritis with negative H. Pylori. Since our last visit, he had repeat EGD on 12/05/2023 significant for small hiatal hernia but normal stomach and duodenum with negative biopsies.  He was undergoing syncope eval with cardiology, had a normal stress echo on 01/06/2024 aside from mild MR, trivial PR, trivial TR.  He saw them again 01/13/2024, was cleared from cardiac standpoint, advised to follow-up in 6 months to check in.  Still having dizziness and paresthesia.  Complains of ongoing tinnitus which was evaluated at ENT/audiology with no specific cause identified yet.  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  Gabapentin was prescribed for symptoms but made him very drowsy so he is not presently using this and does not plan to do in the future.  He was actually supposed to see them 04/26/24, but that was when his abdominal pain brought him to the ED so he has to reschedule his neuro f/u.   He was seen at [facility] 02/22/2024 for lumbar radiculopathy and cervical radiculopathy, for which MRI lumbar and cervical spine were ordered.  He was seen at [facility] 03/02/2024 and diagnosed with left shoulder impingement syndrome and provided with intra-articular cortisone injection.  He was evaluated again by [facility] on 03/20/2024 having failed 6 weeks of physical therapy with worsening symptoms; at the time of that visit he had not yet completed his lumbar/cervical MRIs and was encouraged to do so.  He has follow-up scheduled for 05/18/2024 to review; per patient, only lumbar MRI was done.   He raises concerns that losartan  is causing headaches.  Says he recently tried coming off the medication, headaches improved but blood pressure went up so he restarted, with subsequent return of headaches.  He would like to switch this medicine today.  He has been monitoring blood pressure at home.  Complains of thick sticky mucus chronically from the left side of his nose, says this was the primary side through which he used cocaine years  ago, thinks it has done some damage to the intranasal tissue. Says he can stick his finger all the way up there.   Complains of poor sleep, attributes most of this to his abdominal pain.  Ask about sleep medication.  Medication list has been reviewed and updated.  Current Meds  Medication Sig    acetaminophen (TYLENOL) 500 MG tablet Take 2 tablets (1,000 mg total) by mouth every 6 (six) hours as needed.   propranolol (INDERAL) 20 MG tablet Take 1 tablet (20 mg total) by mouth 3 (three) times daily.   [DISCONTINUED] fluticasone  (FLONASE ) 50 MCG/ACT nasal spray INSTILL 2 SPRAYS INTO BOTH NOSTRILS DAILY.   [DISCONTINUED] losartan  (COZAAR ) 25 MG tablet TAKE 1 TABLET(25 MG) BY MOUTH DAILY   [DISCONTINUED] ondansetron  (ZOFRAN ) 8 MG tablet TAKE 1 TABLET(8 MG) BY MOUTH EVERY 8 HOURS AS NEEDED FOR NAUSEA   [DISCONTINUED] sertraline  (ZOLOFT ) 100 MG tablet TAKE 1 TABLET(100 MG) BY MOUTH DAILY     Review of Systems  Patient Active Problem List   Diagnosis Date Noted   Acquired nasal deformity excluding deviated septum 05/11/2024   Abnormal EMG 05/11/2024   Insomnia 05/11/2024   Low back pain 03/09/2024   Cervical radiculopathy 02/22/2024   Syncope 11/18/2023   Noncompliance 11/18/2023   Dysphagia 05/20/2023   Gastritis without bleeding 05/20/2023   Generalized anxiety disorder with panic attacks 03/10/2023   Severe episode of recurrent major depressive disorder, without psychotic features (HCC) 03/10/2023   Anxiety about health 02/24/2023   Vapes nicotine containing substance 02/24/2023   Primary hypertension 02/24/2023   GERD with esophagitis 02/24/2023   Seasonal allergic rhinitis 02/24/2023   History of substance use disorder 02/24/2023   Shortness of breath 02/24/2023   Foul smelling urine 02/24/2023   Vertigo 02/24/2023    Allergies  Allergen Reactions   Penicillins Dermatitis, Other (See Comments) and Rash    Patient unsure, just knows he is allergic    Immunization History  Administered Date(s) Administered   Influenza, Seasonal, Injecte, Preservative Fre 04/08/2023   Tdap 03/10/2023    Past Surgical History:  Procedure Laterality Date   ESOPHAGOGASTRODUODENOSCOPY N/A 12/05/2023   Procedure: EGD (ESOPHAGOGASTRODUODENOSCOPY);  Surgeon: Jinny Carmine, MD;   Location: [facility] ENDOSCOPY;  Service: Endoscopy;  Laterality: N/A;   ESOPHAGOGASTRODUODENOSCOPY (EGD) WITH PROPOFOL  N/A 05/20/2023   Procedure: ESOPHAGOGASTRODUODENOSCOPY (EGD) WITH PROPOFOL ;  Surgeon: Jinny Carmine, MD;  Location: [facility];  Service: Endoscopy;  Laterality: N/A;    Social History   Tobacco Use   Smoking status: Former    Current packs/day: 0.00    Average packs/day: 1.5 packs/day for 24.7 years (37.1 ttl pk-yrs)    Types: Cigarettes    Start date: 02/23/1998    Quit date: 11/24/2022    Years since quitting: 1.4   Smokeless tobacco: Never  Vaping Use   Vaping status: Every Day   Substances: Nicotine  Substance Use Topics   Alcohol use: No   Drug use: Not Currently    Types: Cocaine, Methamphetamines    Family History  Problem Relation Age of Onset   Heart disease Maternal Grandfather    Heart attack Maternal Grandfather         05/10/2024    1:38 PM 11/18/2023    1:44 PM 09/07/2023   10:08 AM 04/08/2023   10:30 AM  GAD 7 : Generalized Anxiety Score  Nervous, Anxious, on Edge 2 2 2 2   Control/stop worrying 3 3 3 2   Worry too much - different things 3 3  3 2  Trouble relaxing 3 3 3 2   Restless 3 3 3 2   Easily annoyed or irritable 1 1 1 1   Afraid - awful might happen 3 3 3 1   Total GAD 7 Score 18 18 18 12   Anxiety Difficulty Somewhat difficult Somewhat difficult Somewhat difficult Somewhat difficult       05/10/2024    1:38 PM 11/18/2023    1:44 PM 09/07/2023   10:08 AM  Depression screen PHQ 2/9  Decreased Interest 0 1   Down, Depressed, Hopeless 1 1 0  PHQ - 2 Score 1 2 0  Altered sleeping  1   Tired, decreased energy  2   Change in appetite  1   Feeling bad or failure about yourself   0   Trouble concentrating  2   Moving slowly or fidgety/restless  3   Suicidal thoughts  0   PHQ-9 Score  11   Difficult doing work/chores  Not difficult at all     BP Readings from Last 3 Encounters:  05/10/24 (!) 140/96  04/27/24 115/87  04/26/24  128/72    Wt Readings from Last 3 Encounters:  05/10/24 218 lb (98.9 kg)  04/27/24 214 lb 15.2 oz (97.5 kg)  04/26/24 215 lb (97.5 kg)    BP (!) 140/96 (Cuff Size: Normal)   Pulse 69   Temp 98 F (36.7 C)   Ht 6' 2 (1.88 m)   Wt 218 lb (98.9 kg)   SpO2 96%   BMI 27.99 kg/m   Physical Exam Vitals and nursing note reviewed.  Constitutional:      Appearance: Normal appearance.  HENT:     Nose:     Comments: Turbinates enlarged on the right, partially occluding the nasal passage. On the left he has an extremely wide nasal airway with atrophy/destruction of left nasal turbinates. Possible septal deviation toward right without perforation.  Cardiovascular:     Rate and Rhythm: Normal rate.  Pulmonary:     Effort: Pulmonary effort is normal.  Abdominal:     General: There is no distension.  Musculoskeletal:        General: Normal range of motion.  Skin:    General: Skin is warm and dry.  Neurological:     Mental Status: He is alert and oriented to person, place, and time.     Gait: Gait is intact.  Psychiatric:        Mood and Affect: Mood and affect normal.     Recent Labs     Component Value Date/Time   NA 138 04/27/2024 1136   NA 139 11/18/2023 1429   NA 138 08/31/2014 2112   K 3.6 04/27/2024 1136   K 3.8 08/31/2014 2112   CL 103 04/27/2024 1136   CL 105 08/31/2014 2112   CO2 25 04/27/2024 1136   CO2 28 08/31/2014 2112   GLUCOSE 139 (H) 04/27/2024 1136   GLUCOSE 99 08/31/2014 2112   BUN 10 04/27/2024 1136   BUN 10 11/18/2023 1429   BUN 12 08/31/2014 2112   CREATININE 1.05 04/27/2024 1136   CREATININE 1.17 08/31/2014 2112   CALCIUM 8.8 (L) 04/27/2024 1136   CALCIUM 8.9 08/31/2014 2112   PROT 6.9 04/27/2024 1136   PROT 7.2 11/18/2023 1429   ALBUMIN 3.7 04/27/2024 1136   ALBUMIN 4.6 11/18/2023 1429   AST 22 04/27/2024 1136   ALT 16 04/27/2024 1136   ALKPHOS 56 04/27/2024 1136   BILITOT 0.9 04/27/2024 1136   BILITOT  0.4 11/18/2023 1429   GFRNONAA >60  04/27/2024 1136   GFRNONAA >60 08/31/2014 2112   GFRAA >60 06/28/2019 1116   GFRAA >60 08/31/2014 2112    Lab Results  Component Value Date   WBC 6.2 04/27/2024   HGB 15.5 04/27/2024   HCT 43.9 04/27/2024   MCV 88.7 04/27/2024   PLT 225 04/27/2024   No results found for: HGBA1C Lab Results  Component Value Date   CHOL 169 04/27/2024   HDL 56 04/27/2024   LDLCALC 103 (H) 04/27/2024   TRIG 52 04/27/2024   CHOLHDL 3.0 04/27/2024   Lab Results  Component Value Date   TSH 1.028 09/30/2022      Assessment and Plan:  Primary hypertension Assessment & Plan: Stop losartan , try propranolol instead which might help with blood pressure, anxiety, sleep.  Orders: -     Propranolol HCl; Take 1 tablet (20 mg total) by mouth 3 (three) times daily.  Dispense: 60 tablet; Refill: 0  Gastroesophageal reflux disease with esophagitis without hemorrhage Assessment & Plan: Refill pantoprazole . Recent EGD notes reviewed  Orders: -     Pantoprazole  Sodium; Take 1 tablet (40 mg total) by mouth daily.  Dispense: 90 tablet; Refill: 2  Acquired nasal deformity excluding deviated septum Assessment & Plan: Peculiar findings today, likely related to prior cocaine use. Advised to contact ENT to evaluate (he is already established).    Abnormal EMG Assessment & Plan: Advised to rechedule with neuro.    Cervical radiculopathy Assessment & Plan: Follow-up with Ortho as intended 05/18/24.    Insomnia, unspecified type Assessment & Plan: Hopefully between refilling pantoprazole  and starting propranolol, his sleep will improve.  If not, plan to trial sleep aid at short-interval f/u.      Return for OV f/u HTN, sleep, GERD.   I personally spent a total of 50 minutes in the care of the patient today including preparing to see the patient, getting/reviewing separately obtained history, performing a medically appropriate exam/evaluation, counseling and educating, placing orders,  documenting clinical information in the EHR, and communicating results.   Rolan Hoyle, PA-C, DMSc, Nutritionist [facility] [phone]

## 2024-05-11 ENCOUNTER — Other Ambulatory Visit: Payer: Self-pay

## 2024-05-11 DIAGNOSIS — K2100 Gastro-esophageal reflux disease with esophagitis, without bleeding: Secondary | ICD-10-CM

## 2024-05-11 DIAGNOSIS — M950 Acquired deformity of nose: Secondary | ICD-10-CM | POA: Insufficient documentation

## 2024-05-11 DIAGNOSIS — G4700 Insomnia, unspecified: Secondary | ICD-10-CM | POA: Insufficient documentation

## 2024-05-11 DIAGNOSIS — I10 Essential (primary) hypertension: Secondary | ICD-10-CM

## 2024-05-11 DIAGNOSIS — R94131 Abnormal electromyogram [EMG]: Secondary | ICD-10-CM | POA: Insufficient documentation

## 2024-05-11 NOTE — Assessment & Plan Note (Signed)
 Stop losartan , try propranolol instead which might help with blood pressure, anxiety, sleep.

## 2024-05-11 NOTE — Assessment & Plan Note (Signed)
 Follow-up with Ortho as intended 05/18/24.

## 2024-05-11 NOTE — Assessment & Plan Note (Signed)
 Advised to rechedule with neuro.

## 2024-05-11 NOTE — Assessment & Plan Note (Signed)
 Refill pantoprazole . Recent EGD notes reviewed

## 2024-05-11 NOTE — Assessment & Plan Note (Signed)
 Peculiar findings today, likely related to prior cocaine use. Advised to contact ENT to evaluate (he is already established).

## 2024-05-11 NOTE — Assessment & Plan Note (Signed)
 Hopefully between refilling pantoprazole  and starting propranolol, his sleep will improve.  If not, plan to trial sleep aid at short-interval f/u.

## 2024-05-18 DIAGNOSIS — M5416 Radiculopathy, lumbar region: Secondary | ICD-10-CM | POA: Diagnosis not present

## 2024-05-25 ENCOUNTER — Encounter: Payer: Self-pay | Admitting: Physician Assistant

## 2024-05-25 ENCOUNTER — Ambulatory Visit (INDEPENDENT_AMBULATORY_CARE_PROVIDER_SITE_OTHER): Admitting: Physician Assistant

## 2024-05-25 VITALS — BP 124/96 | HR 67 | Temp 97.5°F | Ht 74.0 in | Wt 219.0 lb

## 2024-05-25 DIAGNOSIS — Z23 Encounter for immunization: Secondary | ICD-10-CM

## 2024-05-25 DIAGNOSIS — K2100 Gastro-esophageal reflux disease with esophagitis, without bleeding: Secondary | ICD-10-CM

## 2024-05-25 DIAGNOSIS — Z720 Tobacco use: Secondary | ICD-10-CM

## 2024-05-25 DIAGNOSIS — I10 Essential (primary) hypertension: Secondary | ICD-10-CM | POA: Diagnosis not present

## 2024-05-25 MED ORDER — FAMOTIDINE 20 MG PO TABS: 20.0000 mg | ORAL_TABLET | Freq: Two times a day (BID) | ORAL | 1 refills | Status: AC | PRN

## 2024-05-25 MED ORDER — NICOTINE POLACRILEX 2 MG MT GUM: 2.0000 mg | CHEWING_GUM | OROMUCOSAL | 1 refills | Status: DC | PRN

## 2024-05-25 MED ORDER — PROPRANOLOL HCL 20 MG PO TABS: 20.0000 mg | ORAL_TABLET | Freq: Two times a day (BID) | ORAL | 1 refills | Status: AC

## 2024-05-25 NOTE — Progress Notes (Signed)
 Date:  05/25/2024   Name:  Jacob Mckinney   DOB:  May 23, 1981   MRN:  [id number]   Chief Complaint: Hypertension, Gastroesophageal Reflux, Sleeping Problem, and Nicotine Dependence (Vapes wants to quit)  HPI [name] returns for 2 week f/u on several issues.  Regarding HTN, we stopped losartan  and started propranolol BID instead which he is tolerating well. BP usually 120's/80's at home and he has not had a single headache since he started it.   ENT appt 05/30/24 to discuss his structural/septal issues.  GERD still really bothersome when he lies supine and disruptive to sleep. He has to be semi-reclined to sleep at all, says he only got 2 hours last night.   Trying to stop nicotine vape. Used nicotine gum to quit before.   Medication list has been reviewed and updated.  Current Meds  Medication Sig   acetaminophen (TYLENOL) 500 MG tablet Take 2 tablets (1,000 mg total) by mouth every 6 (six) hours as needed.   famotidine  (PEPCID ) 20 MG tablet Take 1 tablet (20 mg total) by mouth 2 (two) times daily as needed for heartburn or indigestion.   fluticasone  (FLONASE ) 50 MCG/ACT nasal spray SHAKE LIQUID AND USE 2 SPRAYS IN EACH NOSTRIL DAILY   nicotine polacrilex (NICORETTE) 2 MG gum Take 1 each (2 mg total) by mouth as needed for smoking cessation.   ondansetron  (ZOFRAN ) 8 MG tablet TAKE 1 TABLET(8 MG) BY MOUTH EVERY 8 HOURS AS NEEDED FOR NAUSEA   pantoprazole  (PROTONIX ) 40 MG tablet Take 1 tablet (40 mg total) by mouth daily.   sertraline  (ZOLOFT ) 100 MG tablet TAKE 1 TABLET(100 MG) BY MOUTH DAILY   [DISCONTINUED] propranolol (INDERAL) 20 MG tablet Take 1 tablet (20 mg total) by mouth 3 (three) times daily.     Review of Systems  Patient Active Problem List   Diagnosis Date Noted   Acquired nasal deformity excluding deviated septum 05/11/2024   Abnormal EMG 05/11/2024   Insomnia 05/11/2024   Low back pain 03/09/2024   Cervical radiculopathy 02/22/2024   Syncope 11/18/2023    Dysphagia 05/20/2023   Gastritis without bleeding 05/20/2023   Generalized anxiety disorder with panic attacks 03/10/2023   Severe episode of recurrent major depressive disorder, without psychotic features (HCC) 03/10/2023   Anxiety about health 02/24/2023   Vapes nicotine containing substance 02/24/2023   Primary hypertension 02/24/2023   GERD with esophagitis 02/24/2023   Seasonal allergic rhinitis 02/24/2023   History of substance use disorder 02/24/2023   Shortness of breath 02/24/2023   Foul smelling urine 02/24/2023   Vertigo 02/24/2023    Allergies  Allergen Reactions   Penicillins Dermatitis, Other (See Comments) and Rash    Patient unsure, just knows he is allergic    Immunization History  Administered Date(s) Administered   Influenza, Seasonal, Injecte, Preservative Fre 04/08/2023, 05/25/2024   Tdap 03/10/2023    Past Surgical History:  Procedure Laterality Date   ESOPHAGOGASTRODUODENOSCOPY N/A 12/05/2023   Procedure: EGD (ESOPHAGOGASTRODUODENOSCOPY);  Surgeon: Jinny Carmine, MD;  Location: [facility] ENDOSCOPY;  Service: Endoscopy;  Laterality: N/A;   ESOPHAGOGASTRODUODENOSCOPY (EGD) WITH PROPOFOL  N/A 05/20/2023   Procedure: ESOPHAGOGASTRODUODENOSCOPY (EGD) WITH PROPOFOL ;  Surgeon: Jinny Carmine, MD;  Location: [facility];  Service: Endoscopy;  Laterality: N/A;    Social History   Tobacco Use   Smoking status: Former    Current packs/day: 0.00    Average packs/day: 1.5 packs/day for 24.7 years (37.1 ttl pk-yrs)    Types: Cigarettes    Start date:  02/23/1998    Quit date: 11/24/2022    Years since quitting: 1.5   Smokeless tobacco: Never  Vaping Use   Vaping status: Every Day   Substances: Nicotine  Substance Use Topics   Alcohol use: No   Drug use: Not Currently    Types: Cocaine, Methamphetamines    Family History  Problem Relation Age of Onset   Heart disease Maternal Grandfather    Heart attack Maternal Grandfather         05/25/2024    1:17 PM  05/10/2024    1:38 PM 11/18/2023    1:44 PM 09/07/2023   10:08 AM  GAD 7 : Generalized Anxiety Score  Nervous, Anxious, on Edge 0 2 2 2   Control/stop worrying 3 3 3 3   Worry too much - different things 3 3 3 3   Trouble relaxing 3 3 3 3   Restless 3 3 3 3   Easily annoyed or irritable 1 1 1 1   Afraid - awful might happen 3 3 3 3   Total GAD 7 Score 16 18 18 18   Anxiety Difficulty Somewhat difficult Somewhat difficult Somewhat difficult Somewhat difficult       05/25/2024    1:17 PM 05/10/2024    1:38 PM 11/18/2023    1:44 PM  Depression screen PHQ 2/9  Decreased Interest 0 0 1  Down, Depressed, Hopeless 0 1 1  PHQ - 2 Score 0 1 2  Altered sleeping   1  Tired, decreased energy   2  Change in appetite   1  Feeling bad or failure about yourself    0  Trouble concentrating   2  Moving slowly or fidgety/restless   3  Suicidal thoughts   0  PHQ-9 Score   11  Difficult doing work/chores   Not difficult at all    BP Readings from Last 3 Encounters:  05/25/24 (!) 124/96  05/10/24 (!) 140/96  04/27/24 115/87    Wt Readings from Last 3 Encounters:  05/25/24 219 lb (99.3 kg)  05/10/24 218 lb (98.9 kg)  04/27/24 214 lb 15.2 oz (97.5 kg)    BP (!) 124/96   Pulse 67   Temp (!) 97.5 F (36.4 C)   Ht 6' 2 (1.88 m)   Wt 219 lb (99.3 kg)   SpO2 97%   BMI 28.12 kg/m   Physical Exam Vitals and nursing note reviewed.  Constitutional:      Appearance: Normal appearance.  Cardiovascular:     Rate and Rhythm: Normal rate.  Pulmonary:     Effort: Pulmonary effort is normal.  Abdominal:     General: There is no distension.  Musculoskeletal:        General: Normal range of motion.  Skin:    General: Skin is warm and dry.  Neurological:     Mental Status: He is alert and oriented to person, place, and time.     Gait: Gait is intact.  Psychiatric:        Mood and Affect: Mood and affect normal.     Recent Labs     Component Value Date/Time   NA 138 04/27/2024 1136   NA  139 11/18/2023 1429   NA 138 08/31/2014 2112   K 3.6 04/27/2024 1136   K 3.8 08/31/2014 2112   CL 103 04/27/2024 1136   CL 105 08/31/2014 2112   CO2 25 04/27/2024 1136   CO2 28 08/31/2014 2112   GLUCOSE 139 (H) 04/27/2024 1136   GLUCOSE 99  08/31/2014 2112   BUN 10 04/27/2024 1136   BUN 10 11/18/2023 1429   BUN 12 08/31/2014 2112   CREATININE 1.05 04/27/2024 1136   CREATININE 1.17 08/31/2014 2112   CALCIUM 8.8 (L) 04/27/2024 1136   CALCIUM 8.9 08/31/2014 2112   PROT 6.9 04/27/2024 1136   PROT 7.2 11/18/2023 1429   ALBUMIN 3.7 04/27/2024 1136   ALBUMIN 4.6 11/18/2023 1429   AST 22 04/27/2024 1136   ALT 16 04/27/2024 1136   ALKPHOS 56 04/27/2024 1136   BILITOT 0.9 04/27/2024 1136   BILITOT 0.4 11/18/2023 1429   GFRNONAA >60 04/27/2024 1136   GFRNONAA >60 08/31/2014 2112   GFRAA >60 06/28/2019 1116   GFRAA >60 08/31/2014 2112    Lab Results  Component Value Date   WBC 6.2 04/27/2024   HGB 15.5 04/27/2024   HCT 43.9 04/27/2024   MCV 88.7 04/27/2024   PLT 225 04/27/2024   No results found for: HGBA1C Lab Results  Component Value Date   CHOL 169 04/27/2024   HDL 56 04/27/2024   LDLCALC 103 (H) 04/27/2024   TRIG 52 04/27/2024   CHOLHDL 3.0 04/27/2024   Lab Results  Component Value Date   TSH 1.028 09/30/2022      Assessment and Plan:  Primary hypertension Assessment & Plan: Continue current dose of propranolol, continue home BP monitoring.  Reducing/limiting nicotine would certainly help.  Orders: -     Propranolol HCl; Take 1 tablet (20 mg total) by mouth 2 (two) times daily.  Dispense: 180 tablet; Refill: 1  Encounter for immunization -     Flu vaccine trivalent PF, 6mos and older(Flulaval,Afluria,Fluarix,Fluzone)  Vapes nicotine containing substance Assessment & Plan: Try nicotine gum again.   Orders: -     Nicotine Polacrilex; Take 1 each (2 mg total) by mouth as needed for smoking cessation.  Dispense: 100 tablet; Refill: 1  Gastroesophageal  reflux disease with esophagitis without hemorrhage Assessment & Plan: Encouraged to make follow-up with GI.  In meantime try adding famotidine  to his current regimen of pantoprazole , which will hopefully at least keep his symptoms reasonably well-controlled enough to sleep.  Orders: -     Famotidine ; Take 1 tablet (20 mg total) by mouth 2 (two) times daily as needed for heartburn or indigestion.  Dispense: 90 tablet; Refill: 1     Follow-up as needed   Rolan Hoyle, PA-C, DMSc, Nutritionist [facility] [phone]

## 2024-05-25 NOTE — Assessment & Plan Note (Signed)
 Continue current dose of propranolol, continue home BP monitoring.  Reducing/limiting nicotine would certainly help.

## 2024-05-25 NOTE — Assessment & Plan Note (Signed)
 Try nicotine gum again.

## 2024-05-25 NOTE — Assessment & Plan Note (Signed)
 Encouraged to make follow-up with GI.  In meantime try adding famotidine  to his current regimen of pantoprazole , which will hopefully at least keep his symptoms reasonably well-controlled enough to sleep.

## 2024-05-29 DIAGNOSIS — H9313 Tinnitus, bilateral: Secondary | ICD-10-CM | POA: Diagnosis not present

## 2024-05-29 DIAGNOSIS — J342 Deviated nasal septum: Secondary | ICD-10-CM | POA: Diagnosis not present

## 2024-05-29 DIAGNOSIS — R42 Dizziness and giddiness: Secondary | ICD-10-CM | POA: Diagnosis not present

## 2024-06-06 DIAGNOSIS — Z419 Encounter for procedure for purposes other than remedying health state, unspecified: Secondary | ICD-10-CM | POA: Diagnosis not present

## 2024-06-16 ENCOUNTER — Other Ambulatory Visit: Payer: Self-pay | Admitting: Physician Assistant

## 2024-06-16 DIAGNOSIS — R42 Dizziness and giddiness: Secondary | ICD-10-CM

## 2024-06-18 NOTE — Telephone Encounter (Signed)
 Requested medication (s) are due for refill today: yes  Requested medication (s) are on the active medication list: yes  Last refill:  05/11/24  Future visit scheduled: yes  Notes to clinic:  Unable to refill per protocol, cannot delegate.      Requested Prescriptions  Pending Prescriptions Disp Refills   ondansetron  (ZOFRAN ) 8 MG tablet [Pharmacy Med Name: ONDANSETRON  8MG  TABLETS] 30 tablet 0    Sig: TAKE 1 TABLET(8 MG) BY MOUTH EVERY 8 HOURS AS NEEDED FOR NAUSEA     Not Delegated - Gastroenterology: Antiemetics - ondansetron  Failed - 06/18/2024  3:27 PM      Failed - This refill cannot be delegated      Passed - AST in normal range and within 360 days    AST  Date Value Ref Range Status  04/27/2024 22 15 - 41 U/L Final         Passed - ALT in normal range and within 360 days    ALT  Date Value Ref Range Status  04/27/2024 16 0 - 44 U/L Final         Passed - Valid encounter within last 6 months    Recent Outpatient Visits           3 weeks ago Primary hypertension   [place] [facility] Medicine at [facility], [place]QU[place]   1 month ago Primary hypertension   [facility] Primary Care & [facility] Medicine at [facility], [place]ib[place] SQU[place]B, [place]   7 months ago Epigastric pain   [facility] Medicine at [facility], Torib[place] SQUIBB, [place]   9 months ago Syncope, unspecified syncope type   [facility] Primary Care & Sports Medicine at [facility], Torib[place] SQU[place], [place]

## 2024-06-27 DIAGNOSIS — R42 Dizziness and giddiness: Secondary | ICD-10-CM | POA: Diagnosis not present

## 2024-06-27 DIAGNOSIS — G629 Polyneuropathy, unspecified: Secondary | ICD-10-CM | POA: Diagnosis not present

## 2024-06-27 DIAGNOSIS — G5600 Carpal tunnel syndrome, unspecified upper limb: Secondary | ICD-10-CM | POA: Diagnosis not present

## 2024-06-27 DIAGNOSIS — R200 Anesthesia of skin: Secondary | ICD-10-CM | POA: Diagnosis not present

## 2024-06-27 DIAGNOSIS — M25512 Pain in left shoulder: Secondary | ICD-10-CM | POA: Diagnosis not present

## 2024-06-27 DIAGNOSIS — R202 Paresthesia of skin: Secondary | ICD-10-CM | POA: Diagnosis not present

## 2024-06-29 DIAGNOSIS — M25512 Pain in left shoulder: Secondary | ICD-10-CM | POA: Diagnosis not present

## 2024-06-29 DIAGNOSIS — G5600 Carpal tunnel syndrome, unspecified upper limb: Secondary | ICD-10-CM | POA: Diagnosis not present

## 2024-06-29 DIAGNOSIS — R200 Anesthesia of skin: Secondary | ICD-10-CM | POA: Diagnosis not present

## 2024-06-29 DIAGNOSIS — M5416 Radiculopathy, lumbar region: Secondary | ICD-10-CM | POA: Diagnosis not present

## 2024-06-29 DIAGNOSIS — R202 Paresthesia of skin: Secondary | ICD-10-CM | POA: Diagnosis not present

## 2024-06-29 DIAGNOSIS — R42 Dizziness and giddiness: Secondary | ICD-10-CM | POA: Diagnosis not present

## 2024-06-29 DIAGNOSIS — G629 Polyneuropathy, unspecified: Secondary | ICD-10-CM | POA: Diagnosis not present

## 2024-07-02 DIAGNOSIS — J3489 Other specified disorders of nose and nasal sinuses: Secondary | ICD-10-CM | POA: Diagnosis not present

## 2024-07-02 DIAGNOSIS — J342 Deviated nasal septum: Secondary | ICD-10-CM | POA: Diagnosis not present

## 2024-07-04 ENCOUNTER — Telehealth: Payer: Self-pay

## 2024-07-04 NOTE — Telephone Encounter (Signed)
 Copied from CRM #[id number]. Topic: General - Other >> Jul 04, 2024  1:29 PM Sophia H wrote: Reason for CRM: Patient states he just found out at his neurology appointment that he has a b12 deficiency. Wanting to schedule in b12 injections but I am unsure if he needs orders from PCP first. Please reach out and advise # [phone]

## 2024-07-04 NOTE — Telephone Encounter (Signed)
 Please review. Appt? Virtual?  KP

## 2024-07-04 NOTE — Telephone Encounter (Signed)
Sent pt message on Mychart.  KP

## 2024-07-06 DIAGNOSIS — E538 Deficiency of other specified B group vitamins: Secondary | ICD-10-CM | POA: Diagnosis not present

## 2024-07-13 DIAGNOSIS — E538 Deficiency of other specified B group vitamins: Secondary | ICD-10-CM | POA: Diagnosis not present

## 2024-07-18 ENCOUNTER — Other Ambulatory Visit: Payer: Self-pay | Admitting: Physician Assistant

## 2024-07-18 DIAGNOSIS — R42 Dizziness and giddiness: Secondary | ICD-10-CM

## 2024-07-20 ENCOUNTER — Other Ambulatory Visit: Payer: Self-pay

## 2024-07-20 DIAGNOSIS — R42 Dizziness and giddiness: Secondary | ICD-10-CM

## 2024-07-20 MED ORDER — ONDANSETRON HCL 8 MG PO TABS: 8.0000 mg | ORAL_TABLET | Freq: Three times a day (TID) | ORAL | 0 refills | Status: DC | PRN

## 2024-07-20 NOTE — Telephone Encounter (Signed)
 Requested medication (s) are due for refill today: na   Requested medication (s) are on the active medication list: yes   Last refill:  06/18/24 #30 0 refills  Future visit scheduled: no   Notes to clinic:  not delegated per protocol. Do you want to refill Rx?     Requested Prescriptions  Pending Prescriptions Disp Refills   ondansetron  (ZOFRAN ) 8 MG tablet [Pharmacy Med Name: ONDANSETRON  8MG  TABLETS] 30 tablet 0    Sig: TAKE 1 TABLET(8 MG) BY MOUTH EVERY 8 HOURS AS NEEDED FOR NAUSEA     Not Delegated - Gastroenterology: Antiemetics - ondansetron  Failed - 07/20/2024  2:58 PM      Failed - This refill cannot be delegated      Passed - AST in normal range and within 360 days    AST  Date Value Ref Range Status  04/27/2024 22 15 - 41 U/L Final         Passed - ALT in normal range and within 360 days    ALT  Date Value Ref Range Status  04/27/2024 16 0 - 44 U/L Final         Passed - Valid encounter within last 6 months    Recent Outpatient Visits           1 month ago Primary hypertension   [place] [facility] Care & [facility] Medicine at [facility], Toribio SQUIBB, [place]   2 months ago Primary hypertension   [facility] Primary Care & Sports Medicine at [facility], [place]QU[place]   8 months ago Epigastric pain   [facility] Primary Care & Sports Medicine at [facility], Toribio SQUIBB, [place]   10 months ago Syncope, unspecified syncope type   [facility] Care & Sports Medicine at [facility], Torib[place] SQUIBB, [place]

## 2024-07-23 DIAGNOSIS — R202 Paresthesia of skin: Secondary | ICD-10-CM | POA: Diagnosis not present

## 2024-07-23 DIAGNOSIS — R200 Anesthesia of skin: Secondary | ICD-10-CM | POA: Diagnosis not present

## 2024-07-23 DIAGNOSIS — M542 Cervicalgia: Secondary | ICD-10-CM | POA: Diagnosis not present

## 2024-07-27 ENCOUNTER — Other Ambulatory Visit: Payer: Self-pay | Admitting: Otolaryngology

## 2024-08-09 NOTE — Discharge Instructions (Signed)

## 2024-08-14 ENCOUNTER — Encounter: Payer: Self-pay | Admitting: Otolaryngology

## 2024-08-16 ENCOUNTER — Encounter
Admission: RE | Disposition: A | Discharge status: Home / Self Care | Payer: Self-pay | Source: Home / Self Care | Attending: Otolaryngology

## 2024-08-16 ENCOUNTER — Ambulatory Visit
Admission: RE | Admit: 2024-08-16 | Discharge: 2024-08-16 | Disposition: A | Discharge status: Home / Self Care | Attending: Otolaryngology | Admitting: Otolaryngology

## 2024-08-16 ENCOUNTER — Encounter: Payer: Self-pay | Admitting: Anesthesiology

## 2024-08-16 ENCOUNTER — Ambulatory Visit: Payer: Self-pay | Admitting: Anesthesiology

## 2024-08-16 ENCOUNTER — Other Ambulatory Visit: Payer: Self-pay | Admitting: Physician Assistant

## 2024-08-16 ENCOUNTER — Other Ambulatory Visit: Payer: Self-pay

## 2024-08-16 DIAGNOSIS — J343 Hypertrophy of nasal turbinates: Secondary | ICD-10-CM | POA: Insufficient documentation

## 2024-08-16 DIAGNOSIS — J342 Deviated nasal septum: Secondary | ICD-10-CM | POA: Diagnosis present

## 2024-08-16 DIAGNOSIS — Z87891 Personal history of nicotine dependence: Secondary | ICD-10-CM | POA: Diagnosis not present

## 2024-08-16 DIAGNOSIS — R42 Dizziness and giddiness: Secondary | ICD-10-CM

## 2024-08-16 HISTORY — DX: Myoneural disorder, unspecified: G70.9

## 2024-08-16 MED ORDER — DEXTROSE 5 % IV SOLN
600.0000 mg | Freq: Once | INTRAVENOUS | Status: AC
  Administered 2024-08-16: 600 mg via INTRAVENOUS

## 2024-08-16 MED ORDER — FENTANYL CITRATE (PF) 100 MCG/2ML IJ SOLN
INTRAMUSCULAR | Status: AC
  Filled 2024-08-16: qty 2

## 2024-08-16 MED ORDER — DEXAMETHASONE SODIUM PHOSPHATE 4 MG/ML IJ SOLN
INTRAMUSCULAR | Status: DC | PRN
  Administered 2024-08-16: 8 mg via INTRAVENOUS

## 2024-08-16 MED ORDER — PHENYLEPHRINE HCL 0.5 % NA SOLN: NASAL | Status: DC | PRN

## 2024-08-16 MED ORDER — DEXAMETHASONE SODIUM PHOSPHATE 4 MG/ML IJ SOLN
INTRAMUSCULAR | Status: AC
  Filled 2024-08-16: qty 1

## 2024-08-16 MED ORDER — PROPOFOL 10 MG/ML IV BOLUS
INTRAVENOUS | Status: DC | PRN
  Administered 2024-08-16: 200 mg via INTRAVENOUS

## 2024-08-16 MED ORDER — ACETAMINOPHEN 500 MG PO TABS
ORAL_TABLET | ORAL | Status: AC
  Filled 2024-08-16: qty 2

## 2024-08-16 MED ORDER — ONDANSETRON HCL 4 MG/2ML IJ SOLN
INTRAMUSCULAR | Status: DC | PRN
  Administered 2024-08-16: 4 mg via INTRAVENOUS

## 2024-08-16 MED ORDER — OXYCODONE HCL 5 MG PO TABS
ORAL_TABLET | ORAL | Status: AC
  Filled 2024-08-16: qty 1

## 2024-08-16 MED ORDER — OXYCODONE HCL 5 MG PO TABS
5.0000 mg | ORAL_TABLET | Freq: Once | ORAL | Status: AC
  Administered 2024-08-16: 5 mg via ORAL

## 2024-08-16 MED ORDER — MIDAZOLAM HCL 2 MG/2ML IJ SOLN
INTRAMUSCULAR | Status: AC
  Filled 2024-08-16: qty 2

## 2024-08-16 MED ORDER — PREDNISONE 10 MG PO TABS: ORAL_TABLET | ORAL | 0 refills | Status: AC

## 2024-08-16 MED ORDER — CLINDAMYCIN PHOSPHATE 300 MG/2ML IJ SOLN
INTRAMUSCULAR | Status: AC
  Filled 2024-08-16: qty 4

## 2024-08-16 MED ORDER — OXYMETAZOLINE HCL 0.05 % NA SOLN
2.0000 | Freq: Once | NASAL | Status: AC
  Administered 2024-08-16: 2 via NASAL

## 2024-08-16 MED ORDER — DEXMEDETOMIDINE HCL IN NACL 80 MCG/20ML IV SOLN
INTRAVENOUS | Status: DC | PRN
  Administered 2024-08-16: 8 ug via INTRAVENOUS

## 2024-08-16 MED ORDER — ACETAMINOPHEN 500 MG PO TABS
1000.0000 mg | ORAL_TABLET | Freq: Once | ORAL | Status: AC
  Administered 2024-08-16: 1000 mg via ORAL

## 2024-08-16 MED ORDER — LIDOCAINE-EPINEPHRINE 1 %-1:100000 IJ SOLN
INTRAMUSCULAR | Status: DC | PRN
  Administered 2024-08-16: 6 mL

## 2024-08-16 MED ORDER — OXYMETAZOLINE HCL 0.05 % NA SOLN
NASAL | Status: AC
  Filled 2024-08-16: qty 30

## 2024-08-16 MED ORDER — LIDOCAINE HCL (CARDIAC) PF 100 MG/5ML IV SOSY
PREFILLED_SYRINGE | INTRAVENOUS | Status: DC | PRN
  Administered 2024-08-16: 50 mg via INTRAVENOUS

## 2024-08-16 MED ORDER — LACTATED RINGERS IV SOLN: INTRAVENOUS | Status: DC

## 2024-08-16 MED ORDER — FENTANYL CITRATE (PF) 50 MCG/ML IJ SOSY
25.0000 ug | PREFILLED_SYRINGE | Freq: Once | INTRAMUSCULAR | Status: AC
  Administered 2024-08-16: 25 ug via INTRAVENOUS

## 2024-08-16 MED ORDER — CLINDAMYCIN HCL 300 MG PO CAPS: 300.0000 mg | ORAL_CAPSULE | Freq: Three times a day (TID) | ORAL | 0 refills | Status: AC

## 2024-08-16 MED ORDER — FENTANYL CITRATE (PF) 100 MCG/2ML IJ SOLN
INTRAMUSCULAR | Status: DC | PRN
  Administered 2024-08-16 (×2): 50 ug via INTRAVENOUS

## 2024-08-16 MED ORDER — MIDAZOLAM HCL 5 MG/5ML IJ SOLN
INTRAMUSCULAR | Status: DC | PRN
  Administered 2024-08-16: 2 mg via INTRAVENOUS

## 2024-08-16 MED ORDER — SUCCINYLCHOLINE CHLORIDE 200 MG/10ML IV SOSY
PREFILLED_SYRINGE | INTRAVENOUS | Status: DC | PRN
  Administered 2024-08-16: 100 mg via INTRAVENOUS

## 2024-08-16 MED ORDER — PROPOFOL 10 MG/ML IV BOLUS
INTRAVENOUS | Status: AC
  Filled 2024-08-16: qty 20

## 2024-08-16 MED ORDER — HYDROCODONE-ACETAMINOPHEN 5-325 MG PO TABS: 1.0000 | ORAL_TABLET | Freq: Four times a day (QID) | ORAL | 0 refills | Status: AC | PRN

## 2024-08-16 MED ORDER — ONDANSETRON HCL 4 MG/2ML IJ SOLN
INTRAMUSCULAR | Status: AC
  Filled 2024-08-16: qty 2

## 2024-08-16 NOTE — H&P (Signed)
H&P has been reviewed and patient reevaluated, no changes necessary. To be downloaded later.  

## 2024-08-16 NOTE — Anesthesia Postprocedure Evaluation (Signed)
"   Anesthesia Post Note  Patient: Rommel Hogston Whittington  Procedure(s) Performed: SEPTOPLASTY, NOSE (Bilateral: Nose) REDUCTION, NASAL TURBINATE (Bilateral: Nose)  Patient location during evaluation: PACU Anesthesia Type: General Level of consciousness: awake and alert Pain management: pain level controlled Vital Signs Assessment: post-procedure vital signs reviewed and stable Respiratory status: spontaneous breathing, nonlabored ventilation, respiratory function stable and patient connected to nasal cannula oxygen Cardiovascular status: blood pressure returned to baseline and stable Postop Assessment: no apparent nausea or vomiting Anesthetic complications: no   No notable events documented.   Last Vitals:  Vitals:   08/16/24 0928 08/16/24 0930  BP: (!) 145/104 (!) 145/104  Pulse: 62 66  Resp: (!) 26 14  Temp:    SpO2: 97% 100%    Last Pain:  Vitals:   08/16/24 0928  PainSc: 8                  Fairy A Jamile Rekowski      "

## 2024-08-16 NOTE — Op Note (Signed)
 08/16/2024  9:07 AM  Margo Senior  [id number]   Pre-Op Dx:  Deviated Nasal Septum, Hypertrophic Inferior Turbinates  Post-op Dx: Same  Proc: Nasal Septoplasty, Bilateral Partial Reduction Inferior Turbinates   Surg:  Deward DEL Tonika Eden  Anes:  GOT  EBL: 40 mL  Comp: None  Findings: The septum was bowed to the right side with the septum pushing against the turbinate superiorly and posteriorly.  The ethmoid plate was lowered off to the right side.  There is a large spur inferiorly on the left side.  The inferior turbinates were enlarged.  Procedure: With the patient in a comfortable supine position,  general orotracheal anesthesia was induced without difficulty.     The patient received preoperative Afrin spray for topical decongestion and vasoconstriction.  Intravenous prophylactic antibiotics were administered.  At an appropriate level, the patient was placed in a semi-sitting position.  Nasal vibrissae were trimmed.   1% Xylocaine  with 1:100,000 epinephrine , 6 cc's, was infiltrated into the anterior floor of the nose, into the nasal spine region, into the membranous columella, and finally into the submucoperichondrial plane of the septum on both sides.  Several minutes were allowed for this to take effect.  Cottoniod pledgetts soaked in Afrin and 4% Xylocaine  were placed into both nasal cavities and left while the patient was prepped and draped in the standard fashion.  The materials were removed from the nose and observed to be intact and correct in number.  The nose was inspected with a headlight and zero degree scope with the findings as described above.  A left Killian incision was sharply executed and carried down to the quadrangular cartilage. The mucoperichondrium was elelvated along the quadrangular plate back to the bony-cartilaginous junction. The mucoperiostium was then elevated along the ethmoid plate and the vomer. The boney-catilaginous junction was then split with a  freer elevator and the mucoperiosteum was elevated on the opposite side. The mucoperiosteum was then elevated along the maxillary crest as needed to expose the crooked bone of the crest.  Boney spurs of the vomer and maxillary crest were removed with Rudean forceps.  The cartilaginous plate was trimmed along its posterior and inferior borders of about 2 mm of cartilage to free it up inferiorly. Some of the deviated ethmoid plate was then fractured and removed with Takahashi forceps to free up the posterior border of the quadrangular plate and allow it to swing back to the midline. The mucosal flaps were placed back into their anatomic position to allow visualization of the airways. The septum now sat in the midline with an improved airway.  A 3-0 Chromic suture on a Keith needle in used to anchor the inferior septum at the nasal spine with a through and through suture. The mucosal flaps are then sutured together using a through and through whip stitch of 4-0 Plain Gut with a mini-Keith needle. This was used to close the Vining incision as well.   The inferior turbinates were then inspected. An incision was created along the inferior aspect of the left inferior turbinate with removal of some of the inferior soft tissue and bone. Electrocautery was used to control bleeding in the area. The remaining turbinate was then outfractured to open up the airway further. There was no significant bleeding noted. The right turbinate was then trimmed and outfractured in a similar fashion.  The airways were then visualized and showed open passageways on both sides that were significantly improved compared to before surgery. There was no signifcant bleeding. Nasal  splints were applied to both sides of the septum using Xomed 0.49mm regular sized splints that were trimmed, and then held in position with a 3-0 Nylon through and through suture.  The patient was turned back over to anesthesia, and awakened, extubated, and  taken to the PACU in satisfactory condition.  Dispo:   PACU to home  Plan: Ice, elevation, narcotic analgesia, steroid taper, and prophylactic antibiotics for the duration of indwelling nasal foreign bodies.  We will reevaluate the patient in the office in 6 days and remove the septal splints.  Return to work in 10 days, strenuous activities in two weeks.   Deward DEL Leighann Amadon 08/16/2024 9:07 AM

## 2024-08-16 NOTE — Anesthesia Preprocedure Evaluation (Signed)
"                                    Anesthesia Evaluation    Airway Mallampati: II  TM Distance: >3 FB Neck ROM: Full    Dental no notable dental hx.    Pulmonary former smoker   Pulmonary exam normal breath sounds clear to auscultation       Cardiovascular Normal cardiovascular exam Rhythm:Regular Rate:Normal     Neuro/Psych    GI/Hepatic   Endo/Other    Renal/GU      Musculoskeletal   Abdominal   Peds  Hematology   Anesthesia Other Findings   Reproductive/Obstetrics                              Anesthesia Physical Anesthesia Plan  ASA: 2  Anesthesia Plan: General   Post-op Pain Management:    Induction: Intravenous  PONV Risk Score and Plan:   Airway Management Planned:   Additional Equipment:   Intra-op Plan:   Post-operative Plan: Extubation in OR  Informed Consent: I have reviewed the patients History and Physical, chart, labs and discussed the procedure including the risks, benefits and alternatives for the proposed anesthesia with the patient or authorized representative who has indicated his/her understanding and acceptance.     Dental advisory given  Plan Discussed with: CRNA  Anesthesia Plan Comments:         Anesthesia Quick Evaluation  "

## 2024-08-16 NOTE — Anesthesia Procedure Notes (Signed)
 Procedure Name: Intubation Date/Time: 08/16/2024 8:08 AM  Performed by: Levy Harvey, CRNAPre-anesthesia Checklist: Patient identified, Emergency Drugs available, Suction available, Patient being monitored and Timeout performed Patient Re-evaluated:Patient Re-evaluated prior to induction Oxygen Delivery Method: Circle system utilized Preoxygenation: Pre-oxygenation with 100% oxygen Induction Type: IV induction Ventilation: Mask ventilation without difficulty Laryngoscope Size: Mac and 4 Grade View: Grade I Tube type: Oral Rae Tube size: 7.5 mm Number of attempts: 1 Airway Equipment and Method: LTA kit utilized Placement Confirmation: ETT inserted through vocal cords under direct vision, positive ETCO2 and breath sounds checked- equal and bilateral Tube secured with: Tape Dental Injury: Teeth and Oropharynx as per pre-operative assessment

## 2024-08-16 NOTE — Transfer of Care (Signed)
 Immediate Anesthesia Transfer of Care Note  Patient: Jacob Mckinney  Procedure(s) Performed: SEPTOPLASTY, NOSE (Bilateral: Nose) REDUCTION, NASAL TURBINATE (Bilateral: Nose)  Patient Location: PACU  Anesthesia Type: General  Level of Consciousness: awake, alert  and patient cooperative  Airway and Oxygen Therapy: Patient Spontanous Breathing and Patient connected to supplemental oxygen  Post-op Assessment: Post-op Vital signs reviewed, Patient's Cardiovascular Status Stable, Respiratory Function Stable, Patent Airway and No signs of Nausea or vomiting  Post-op Vital Signs: Reviewed and stable  Complications: No notable events documented.

## 2024-08-17 ENCOUNTER — Encounter: Payer: Self-pay | Admitting: Otolaryngology

## 2024-08-17 NOTE — Telephone Encounter (Signed)
 Requested medication (s) are due for refill today: yes  Requested medication (s) are on the active medication list: yes  Last refill:  07/20/24  Future visit scheduled: yes  Notes to clinic:  Unable to refill per protocol, cannot delegate.      Requested Prescriptions  Pending Prescriptions Disp Refills   ondansetron  (ZOFRAN ) 8 MG tablet [Pharmacy Med Name: ONDANSETRON  8MG  TABLETS] 30 tablet 0    Sig: TAKE 1 TABLET(8 MG) BY MOUTH EVERY 8 HOURS AS NEEDED FOR NAUSEA OR VOMITING     Not Delegated - Gastroenterology: Antiemetics - ondansetron  Failed - 08/17/2024 11:18 AM      Failed - This refill cannot be delegated      Passed - AST in normal range and within 360 days    AST  Date Value Ref Range Status  04/27/2024 22 15 - 41 U/L Final         Passed - ALT in normal range and within 360 days    ALT  Date Value Ref Range Status  04/27/2024 16 0 - 44 U/L Final         Passed - Valid encounter within last 6 months    Recent Outpatient Visits           2 months ago Primary hypertension   [place] Primary Care & Sports Medicine at [facility], [place]   3 months ago Primary hypertension   [facility] Primary Care & [facility] Medicine at [facility], [place]   9 months ago Epigastric pain   [facility] Primary Care & Sports Medicine at [facility], Tor[place] SQU[place], [place]   11 months ago Syncope, unspecified syncope type   [facility] Primary Care & Sports Medicine at [facility], Tor[place] SQU[place], [place]

## 2024-09-03 ENCOUNTER — Encounter (INDEPENDENT_AMBULATORY_CARE_PROVIDER_SITE_OTHER): Payer: Self-pay | Admitting: Vascular Surgery

## 2024-09-03 ENCOUNTER — Encounter (INDEPENDENT_AMBULATORY_CARE_PROVIDER_SITE_OTHER): Payer: Self-pay

## 2024-10-03 ENCOUNTER — Encounter (INDEPENDENT_AMBULATORY_CARE_PROVIDER_SITE_OTHER): Admitting: Nurse Practitioner

## 2024-10-03 ENCOUNTER — Encounter (INDEPENDENT_AMBULATORY_CARE_PROVIDER_SITE_OTHER): Payer: Self-pay
